# Patient Record
Sex: Female | Born: 1945 | Race: White | Hispanic: No | Marital: Married | Smoking: Never smoker
Health system: Southern US, Community
[De-identification: ages and names within clinical notes are randomized; demographics above are authoritative.]

## PROBLEM LIST (undated history)

## (undated) ENCOUNTER — Ambulatory Visit: Admission: EM | Payer: Medicare HMO | Source: Home / Self Care

## (undated) DIAGNOSIS — E785 Hyperlipidemia, unspecified: Secondary | ICD-10-CM

## (undated) DIAGNOSIS — I456 Pre-excitation syndrome: Secondary | ICD-10-CM

## (undated) DIAGNOSIS — C4490 Unspecified malignant neoplasm of skin, unspecified: Secondary | ICD-10-CM

## (undated) DIAGNOSIS — E119 Type 2 diabetes mellitus without complications: Secondary | ICD-10-CM

## (undated) DIAGNOSIS — G459 Transient cerebral ischemic attack, unspecified: Secondary | ICD-10-CM

## (undated) DIAGNOSIS — I10 Essential (primary) hypertension: Secondary | ICD-10-CM

## (undated) HISTORY — DX: Pre-excitation syndrome: I45.6

## (undated) HISTORY — PX: SEGMENTECOMY: SHX5076

## (undated) HISTORY — PX: BREAST EXCISIONAL BIOPSY: SUR124

## (undated) HISTORY — PX: BREAST BIOPSY: SHX20

## (undated) HISTORY — DX: Transient cerebral ischemic attack, unspecified: G45.9

## (undated) HISTORY — DX: Hyperlipidemia, unspecified: E78.5

## (undated) HISTORY — DX: Unspecified malignant neoplasm of skin, unspecified: C44.90

## (undated) HISTORY — PX: OTHER SURGICAL HISTORY: SHX169

## (undated) HISTORY — PX: BACK SURGERY: SHX140

---

## 1989-06-10 HISTORY — PX: CHOLECYSTECTOMY: SHX55

## 2004-03-21 ENCOUNTER — Ambulatory Visit: Payer: Self-pay | Admitting: Internal Medicine

## 2006-04-18 ENCOUNTER — Ambulatory Visit: Payer: Self-pay | Admitting: Gastroenterology

## 2008-12-13 ENCOUNTER — Ambulatory Visit: Payer: Self-pay | Admitting: Family Medicine

## 2010-11-15 ENCOUNTER — Ambulatory Visit: Payer: Self-pay | Admitting: Allergy

## 2011-08-15 ENCOUNTER — Institutional Professional Consult (permissible substitution): Payer: Self-pay | Admitting: Pulmonary Disease

## 2011-09-24 ENCOUNTER — Encounter: Payer: Self-pay | Admitting: Pulmonary Disease

## 2011-09-24 ENCOUNTER — Ambulatory Visit (INDEPENDENT_AMBULATORY_CARE_PROVIDER_SITE_OTHER): Payer: Medicare Other | Admitting: Pulmonary Disease

## 2011-09-24 DIAGNOSIS — J45909 Unspecified asthma, uncomplicated: Secondary | ICD-10-CM

## 2011-09-24 DIAGNOSIS — J310 Chronic rhinitis: Secondary | ICD-10-CM | POA: Insufficient documentation

## 2011-09-24 DIAGNOSIS — K219 Gastro-esophageal reflux disease without esophagitis: Secondary | ICD-10-CM

## 2011-09-24 DIAGNOSIS — I456 Pre-excitation syndrome: Secondary | ICD-10-CM

## 2011-09-24 DIAGNOSIS — E785 Hyperlipidemia, unspecified: Secondary | ICD-10-CM | POA: Insufficient documentation

## 2011-09-24 NOTE — Assessment & Plan Note (Signed)
Well controlled with diet and exercise  Plan: -reviewed dietary and lifestyle modifications in clinic today

## 2011-09-24 NOTE — Patient Instructions (Signed)
Continue taking your Advair and proAir as prescribed. We will see you back in two months and decide at that point if you could step down your Advair to an inhaled steroid alone.

## 2011-09-24 NOTE — Assessment & Plan Note (Addendum)
Debbie Wyatt symptoms of intermittent wheezing, cough and dyspnea are consistent with mild persistent asthma which is very well controlled on Advair.  She had markedly more symptoms off the controller med but now very rarely has cough or wheeze.  She really doesn't need both salmeterol and fluticasone, so we talked today about stepping down her therapy to an inhaled steroid alone.    Plan: -obtain records from Dr. Mayo Ao -she would prefer to stay on the Advair through the allergy season -in three months we will discuss changing the Advair to an inhaled steroid alone -continue albuterol prn -advised to use the Magic Mouthwash she has at home and to rinse her mouth after inhaler use

## 2011-09-24 NOTE — Assessment & Plan Note (Signed)
It sounds like this is mostly allergic in nature and currently under control.  Plan: -obtain records from Allergist -continue montelukast, flonase as written

## 2011-09-24 NOTE — Progress Notes (Signed)
Subjective:    Patient ID: Debbie Wyatt, female    DOB: 1945-07-20, 66 y.o.   MRN: [id number]  HPI This is a 66 y/o female with recurrent sinus infections, allergic rhinitis and asthma presents to our clinic for evalution of asthma.  She had a normal childhood without significant respiratory symptoms.  She developed allergic rhinitis later in adulthood and has been seen by an allergist and ENT physician for the same.  She has developed multiple respiratory infections over the years and has been on many lengthy steroid and antibiotic courses.  She has undergone allergy testing but can't recall the names of the allergens.  She was eventually referred to a pulmonologist when she started developing mucus production, wheezing and cough.  She saw Dr. Mayo Ao who said that she had asthma and continued the Symbicort which had been started by her PCP.  She recently started using Advair due to insurance reasons.  She states that her cough, wheezing and mucus production have been very well controlled on the Advair and she only needs to use albuterol once every month or so.  She denies chest pain or significant swelling in her legs.  She has occassional reflux which she is able to control well by controlling her diet.  She has had multiple episodes of bronchitis over the years which typically occur after sinus infections. She often responds to Azithromycin, but when this doesn't work she responds well to Levaquin.  Past Medical History  Diagnosis Date  . WPW (Wolff-Parkinson-White syndrome)   . Asthma   . Hyperlipidemia   . Skin cancer   . Glaucoma   . TIA (transient ischemic attack)      Family History  Problem Relation Age of Onset  . Emphysema Father     smoked and was a Printmaker  . Heart disease Mother      History   Social History  . Marital Status: Married    Spouse Name: N/A    Number of Children: 2  . Years of Education: N/A   Occupational History  . Retired Runner, broadcasting/film/video   . Hospital  Chaplain    Social History Main Topics  . Smoking status: Never Smoker   . Smokeless tobacco: Never Used  . Alcohol Use: No  . Drug Use: No  . Sexually Active: Not on file   Other Topics Concern  . Not on file   Social History Narrative  . No narrative on file     Allergies  Allergen Reactions  . Percocet (Oxycodone-Acetaminophen) Itching  . Bextra (Valdecoxib) Palpitations     No outpatient prescriptions prior to visit.    Review of Systems  Constitutional: Negative for fever, chills and unexpected weight change.  HENT: Positive for congestion, sneezing and sinus pressure. Negative for ear pain, nosebleeds, sore throat, rhinorrhea, trouble swallowing, dental problem, voice change and postnasal drip.   Eyes: Negative for visual disturbance.  Respiratory: Positive for cough. Negative for choking and shortness of breath.   Cardiovascular: Negative for chest pain and leg swelling.  Gastrointestinal: Negative for vomiting, abdominal pain and diarrhea.  Genitourinary: Negative for difficulty urinating.  Musculoskeletal: Positive for arthralgias.  Skin: Negative for rash.  Neurological: Positive for headaches. Negative for tremors and syncope.  Hematological: Does not bruise/bleed easily.       Objective:   Physical Exam Filed Vitals:   09/24/11 1516  BP: 114/66  Pulse: 70  Temp: 97.9 F (36.6 C)  TempSrc: Oral  Height: 5\' 2"  (1.575 m)  Weight: 214 lb (97.07 kg)  SpO2: 99%  BMI 39.6  Gen: morbidly obese white female in no acute distress HEENT: NCAT, PERRL, EOMi, OP with clear thrush, neck supple without masses PULM: CTA B CV: RRR, no mgr, no JVD AB: BS+, soft, nontender, no hsm Ext: warm, trace edema, no clubbing, no cyanosis Derm: no rash or skin breakdown Neuro: A&Ox4, CN II-XII intact, strength 5/5 in all 4 extremities  09/23/11 Simple spirometry: Ratio 80%, FEV1 2.36L 111% predicted    Assessment & Plan:   Asthma Ms. Doggett's symptoms of intermittent  wheezing, cough and dyspnea are consistent with mild persistent asthma which is very well controlled on Advair.  She had markedly more symptoms off the controller med but now very rarely has cough or wheeze.  She really doesn't need both salmeterol and fluticasone, so we talked today about stepping down her therapy to an inhaled steroid alone.    Plan: -obtain records from Dr. Mayo Ao -she would prefer to stay on the Advair through the allergy season -in three months we will discuss changing the Advair to an inhaled steroid alone -continue albuterol prn -advised to use the Magic Mouthwash she has at home and to rinse her mouth after inhaler use  GERD (gastroesophageal reflux disease) Well controlled with diet and exercise  Plan: -reviewed dietary and lifestyle modifications in clinic today  Rhinitis, chronic It sounds like this is mostly allergic in nature and currently under control.  Plan: -obtain records from Allergist -continue montelukast, flonase as written    Updated Medication List Outpatient Encounter Prescriptions as of 09/24/2011  Medication Sig Dispense Refill  . Fluticasone-Salmeterol (ADVAIR) 250-50 MCG/DOSE AEPB Inhale 1 puff into the lungs every 12 (twelve) hours.      Marland Kitchen amLODipine-atorvastatin (CADUET) 10-40 MG per tablet Take 1 tablet by mouth daily.      Marland Kitchen atenolol (TENORMIN) 50 MG tablet Take 50 mg by mouth 2 (two) times daily.      . Azelastine HCl (ASTEPRO) 0.15 % SOLN Place 1 puff into the nose daily.      . brimonidine (ALPHAGAN) 0.15 % ophthalmic solution Place 1 drop into both eyes 2 (two) times daily.      . citalopram (CELEXA) 20 MG tablet Take 20 mg by mouth daily.      . dorzolamide-timolol (COSOPT) 22.3-6.8 MG/ML ophthalmic solution Place 1 drop into both eyes 2 (two) times daily.      . fluticasone (FLONASE) 50 MCG/ACT nasal spray Place 2 sprays into the nose daily.      Marland Kitchen latanoprost (XALATAN) 0.005 % ophthalmic solution Place 1 drop into both eyes  daily.      Marland Kitchen levocetirizine (XYZAL) 5 MG tablet Take 5 mg by mouth every evening.      . montelukast (SINGULAIR) 10 MG tablet Take 10 mg by mouth at bedtime.      Marland Kitchen omeprazole (PRILOSEC) 20 MG capsule Take 20 mg by mouth daily.      . Sodium Chloride-Sodium Bicarb (CLASSIC NETI POT SINUS WASH NA) Use as directed as needed      . TETRACYCLINE HCL PO Take 1 tablet by mouth 2 (two) times daily.

## 2011-09-25 ENCOUNTER — Ambulatory Visit (INDEPENDENT_AMBULATORY_CARE_PROVIDER_SITE_OTHER)
Admission: RE | Admit: 2011-09-25 | Discharge: 2011-09-25 | Disposition: A | Discharge status: Home / Self Care | Payer: Medicare Other | Source: Ambulatory Visit | Attending: Pulmonary Disease | Admitting: Pulmonary Disease

## 2011-09-25 ENCOUNTER — Telehealth: Payer: Self-pay | Admitting: *Deleted

## 2011-09-25 DIAGNOSIS — J45909 Unspecified asthma, uncomplicated: Secondary | ICD-10-CM

## 2011-09-25 NOTE — Telephone Encounter (Signed)
Pt called back. Informed her of cxr results.  Pt verbalized understanding. Nothing further needed.

## 2011-09-25 NOTE — Telephone Encounter (Signed)
Message copied by Salli Quarry on Wed Sep 25, 2011  4:17 PM ------      Message from: Veto Kemps B      Created: Wed Sep 25, 2011 11:22 AM       L or M,            Can you let her know that her CXR was normal?            Thanks,      B

## 2011-09-25 NOTE — Telephone Encounter (Signed)
LMOM for pt TCB 

## 2011-09-27 ENCOUNTER — Telehealth: Payer: Self-pay | Admitting: *Deleted

## 2011-09-27 NOTE — Telephone Encounter (Signed)
Spoke with pt and notified per Dr. Kendrick Fries that her cxr was normal. Pt verbalized understanding and states no questions.

## 2011-09-27 NOTE — Telephone Encounter (Signed)
Message copied by Christen Butter on Fri Sep 27, 2011  1:05 PM ------      Message from: Veto Kemps B      Created: Wed Sep 25, 2011 11:22 AM       L or M,            Can you let her know that her CXR was normal?            Thanks,      B

## 2011-11-11 ENCOUNTER — Ambulatory Visit: Payer: Medicare Other | Admitting: Pulmonary Disease

## 2011-11-21 ENCOUNTER — Encounter: Payer: Self-pay | Admitting: Pulmonary Disease

## 2011-11-21 ENCOUNTER — Ambulatory Visit (INDEPENDENT_AMBULATORY_CARE_PROVIDER_SITE_OTHER): Payer: Medicare Other | Admitting: Pulmonary Disease

## 2011-11-21 VITALS — BP 110/62 | HR 61 | Temp 98.3°F | Ht 62.0 in | Wt 215.8 lb

## 2011-11-21 DIAGNOSIS — J310 Chronic rhinitis: Secondary | ICD-10-CM

## 2011-11-21 MED ORDER — FLUTICASONE PROPIONATE HFA 110 MCG/ACT IN AERO: 1.0000 | INHALATION_SPRAY | Freq: Two times a day (BID) | RESPIRATORY_TRACT | Status: DC

## 2011-11-21 NOTE — Assessment & Plan Note (Signed)
This has been a stable interval for Debbie Wyatt with no rescue inhaler use. I think the most reasonable approach at this point is to de-escalate therapy by changing her from advair to flovent.  She will call us if her symptoms get worse after making this change.  She should continue her other therapies as written.  We will see her back in 2 months as she typically gets sick in August each year.  After that she can follow up on a 6 month basis.

## 2011-11-21 NOTE — Progress Notes (Signed)
Subjective:    Patient ID: Debbie Wyatt, female    DOB: 12-12-1945, 66 y.o.   MRN: [id number]  Synopsis: [name] is a pleasant lady with asthma and allergic rhinitis who first came to the [facility] in 09/2011.  09/23/11 Simple spirometry: Ratio 80%, FEV1 2.36L 111% predicted   HPI  11/21/11 ROV - She states that she has been doing very well.  She has not had to use her albuterol since the last visit and she made it through allergy season with little problems.  She recently changed to advair from symbicort due to insurance issues.  Her allergic rhinitis is doing well despite the allergens out.  She has had minimal shortness of breath.  She is willing to de-escalate her therapy.   Review of Systems Not obtained    Objective:   Physical Exam  Filed Vitals:   11/21/11 1342  BP: 110/62  Pulse: 61  Temp: 98.3 F (36.8 C)  TempSrc: Oral  Height: 5\' 2"  (1.575 m)  Weight: 215 lb 12.8 oz (97.886 kg)  SpO2: 97%   Gen: well appearing, no acute distress HEENT: NCAT, PERRL, EOMi, nasal mucosa erythematous, non edematous PULM: CTA B CV: RRR, no mgr, cannot assess JVD AB: BS+, soft, nontender, no hsm Ext: warm, trace edema, no clubbing, no cyanosis  09/23/11 Simple spirometry: Ratio 80%, FEV1 2.36L 111% predicted      Assessment & Plan:   Asthma This has been a stable interval for [name] with no rescue inhaler use. I think the most reasonable approach at this point is to de-escalate therapy by changing her from advair to flovent.  She will call us if her symptoms get worse after making this change.  She should continue her other therapies as written.  We will see her back in 2 months as she typically gets sick in August each year.  After that she can follow up on a 6 month basis.    Rhinitis, chronic Continue singulair and inhaled steroid.   Updated Medication List Outpatient Encounter Prescriptions as of 11/21/2011  Medication Sig Dispense Refill  .  amLODipine-atorvastatin (CADUET) 10-40 MG per tablet Take 1 tablet by mouth daily.      Marland Kitchen atenolol (TENORMIN) 50 MG tablet Take 50 mg by mouth 2 (two) times daily.      . Azelastine HCl (ASTEPRO) 0.15 % SOLN Place 1 puff into the nose daily.      . brimonidine (ALPHAGAN) 0.15 % ophthalmic solution Place 1 drop into both eyes 2 (two) times daily.      . citalopram (CELEXA) 20 MG tablet Take 20 mg by mouth daily.      . dorzolamide-timolol (COSOPT) 22.3-6.8 MG/ML ophthalmic solution Place 1 drop into both eyes 2 (two) times daily.      . fluticasone (FLONASE) 50 MCG/ACT nasal spray Place 2 sprays into the nose daily.      Marland Kitchen latanoprost (XALATAN) 0.005 % ophthalmic solution Place 1 drop into both eyes daily.      Marland Kitchen levocetirizine (XYZAL) 5 MG tablet Take 5 mg by mouth every evening.      . montelukast (SINGULAIR) 10 MG tablet Take 10 mg by mouth at bedtime.      Marland Kitchen omeprazole (PRILOSEC) 20 MG capsule Take 20 mg by mouth daily.      . Sodium Chloride-Sodium Bicarb (CLASSIC NETI POT SINUS WASH NA) Use as directed as needed      . DISCONTD: Fluticasone-Salmeterol (ADVAIR) 250-50 MCG/DOSE AEPB Inhale 1  puff into the lungs every 12 (twelve) hours.      . fluticasone (FLOVENT HFA) 110 MCG/ACT inhaler Inhale 1 puff into the lungs 2 (two) times daily.  1 Inhaler  2  . DISCONTD: fluticasone (FLOVENT HFA) 110 MCG/ACT inhaler Inhale 1 puff into the lungs 2 (two) times daily.  1 Inhaler  2  . DISCONTD: TETRACYCLINE HCL PO Take 1 tablet by mouth 2 (two) times daily.

## 2011-11-21 NOTE — Patient Instructions (Signed)
Stop Advair. Start flovent HFA 1 puff twice a day Call us if you notice shortness of breath, wheezing, cough. We will see you back in 2 months, overbook OK

## 2011-11-21 NOTE — Assessment & Plan Note (Signed)
Continue singulair and inhaled steroid.

## 2012-01-15 ENCOUNTER — Ambulatory Visit: Payer: Medicare Other | Admitting: Pulmonary Disease

## 2012-02-13 ENCOUNTER — Encounter: Payer: Self-pay | Admitting: Pulmonary Disease

## 2012-02-13 ENCOUNTER — Ambulatory Visit (INDEPENDENT_AMBULATORY_CARE_PROVIDER_SITE_OTHER): Payer: Medicare Other | Admitting: Pulmonary Disease

## 2012-02-13 VITALS — BP 144/80 | HR 77 | Temp 97.8°F | Ht 62.0 in | Wt 219.0 lb

## 2012-02-13 DIAGNOSIS — Z23 Encounter for immunization: Secondary | ICD-10-CM

## 2012-02-13 DIAGNOSIS — J45909 Unspecified asthma, uncomplicated: Secondary | ICD-10-CM

## 2012-02-13 MED ORDER — FLUTICASONE PROPIONATE HFA 110 MCG/ACT IN AERO: 1.0000 | INHALATION_SPRAY | Freq: Two times a day (BID) | RESPIRATORY_TRACT | Status: DC

## 2012-02-13 NOTE — Progress Notes (Signed)
Subjective:    Patient ID: Debbie Wyatt, female    DOB: 1945/08/24, 66 y.o.   MRN: [id number]  Synopsis: Ms. Jae Dire is a pleasant lady with asthma and allergic rhinitis who first came to the [facility] in 09/2011.  09/23/11 Simple spirometry: Ratio 80%, FEV1 2.36L 111% predicted   HPI   11/21/11 ROV - She states that she has been doing very well.  She has not had to use her albuterol since the last visit and she made it through allergy season with little problems.  She recently changed to advair from symbicort due to insurance issues.  Her allergic rhinitis is doing well despite the allergens out.  She has had minimal shortness of breath.  She is willing to de-escalate her therapy.  02/13/2012 ROV -- She has been doing well.  Since stepping down therapy to flovent she has not used albuterol once.  She has experienced some allergies in the last few days but no respiratory symptoms.  Her allergic rhinitis is well controlled.    Review of Systems  Not obtained    Objective:   Physical Exam   Filed Vitals:   02/13/12 1633  BP: 144/80  Pulse: 77  Temp: 97.8 F (36.6 C)  TempSrc: Oral  Height: 5\' 2"  (1.575 m)  Weight: 219 lb (99.338 kg)  SpO2: 97%   Gen: well appearing, no acute distress HEENT: NCAT, PERRL, EOMi, nasal mucosa erythematous, non edematous PULM: CTA B CV: RRR, no mgr, cannot assess JVD AB: BS+, soft, nontender, no hsm Ext: warm, trace edema, no clubbing, no cyanosis  09/23/11 Simple spirometry: Ratio 80%, FEV1 2.36L 111% predicted      Assessment & Plan:   Asthma Very stable interval after stepping down therapy to Flovent  Plan: -continue Flovent as written -continue prn albuterol -f/u in six months    Updated Medication List Outpatient Encounter Prescriptions as of 02/13/2012  Medication Sig Dispense Refill  . atenolol (TENORMIN) 50 MG tablet Take 50 mg by mouth 2 (two) times daily.      Marland Kitchen atorvastatin (LIPITOR) 40 MG tablet Take 40 mg by mouth  daily.      . Azelastine HCl (ASTEPRO) 0.15 % SOLN Place 1 puff into the nose daily.      . brimonidine (ALPHAGAN) 0.15 % ophthalmic solution Place 1 drop into both eyes 2 (two) times daily.      . citalopram (CELEXA) 20 MG tablet Take 20 mg by mouth daily.      . dorzolamide-timolol (COSOPT) 22.3-6.8 MG/ML ophthalmic solution Place 1 drop into both eyes 2 (two) times daily.      . fluticasone (FLONASE) 50 MCG/ACT nasal spray Place 2 sprays into the nose daily.      . fluticasone (FLOVENT HFA) 110 MCG/ACT inhaler Inhale 1 puff into the lungs 2 (two) times daily.  3 Inhaler  1  . latanoprost (XALATAN) 0.005 % ophthalmic solution Place 1 drop into both eyes daily.      Marland Kitchen levocetirizine (XYZAL) 5 MG tablet Take 5 mg by mouth every evening.      . montelukast (SINGULAIR) 10 MG tablet Take 10 mg by mouth at bedtime.      Marland Kitchen omeprazole (PRILOSEC) 20 MG capsule Take 20 mg by mouth daily.      . Sodium Chloride-Sodium Bicarb (CLASSIC NETI POT SINUS WASH NA) Use as directed as needed      . DISCONTD: fluticasone (FLOVENT HFA) 110 MCG/ACT inhaler Inhale 1 puff into the lungs 2 (  two) times daily.  1 Inhaler  2  . DISCONTD: amLODipine-atorvastatin (CADUET) 10-40 MG per tablet Take 1 tablet by mouth daily.

## 2012-02-13 NOTE — Patient Instructions (Signed)
Keep taking the Flovent as you are now We will see you back in six months or sooner if needed

## 2012-02-13 NOTE — Assessment & Plan Note (Signed)
Very stable interval after stepping down therapy to Flovent  Plan: -continue Flovent as written -continue prn albuterol -f/u in six months

## 2012-08-18 ENCOUNTER — Ambulatory Visit: Payer: Medicare Other | Admitting: Pulmonary Disease

## 2012-08-18 ENCOUNTER — Telehealth: Payer: Self-pay | Admitting: Pulmonary Disease

## 2012-08-18 NOTE — Telephone Encounter (Signed)
ATC patient x3. No return call back. Sent letter 08/18/12.  °

## 2012-09-09 ENCOUNTER — Ambulatory Visit (INDEPENDENT_AMBULATORY_CARE_PROVIDER_SITE_OTHER): Payer: Medicare Other | Admitting: Emergency Medicine

## 2012-09-09 ENCOUNTER — Encounter: Payer: Self-pay | Admitting: Emergency Medicine

## 2012-09-09 VITALS — BP 108/72 | HR 69 | Temp 98.0°F | Ht 62.0 in | Wt 213.0 lb

## 2012-09-09 DIAGNOSIS — J069 Acute upper respiratory infection, unspecified: Secondary | ICD-10-CM

## 2012-09-09 NOTE — Progress Notes (Signed)
Subjective:    Patient ID: Debbie Wyatt, female    DOB: May 23, 1946, 67 y.o.   MRN: [id number]  Synopsis: Debbie Wyatt is a pleasant lady with asthma and allergic rhinitis who first came to the [facility] in 09/2011.  09/23/11 Simple spirometry: Ratio 80%, FEV1 2.36L 111% predicted   HPI  11/21/11 ROV - She states that she has been doing very well.  She has not had to use her albuterol since the last visit and she made it through allergy season with little problems.  She recently changed to advair from symbicort due to insurance issues.  Her allergic rhinitis is doing well despite the allergens out.  She has had minimal shortness of breath.  She is willing to de-escalate her therapy.  02/13/2012 ROV -- She has been doing well.  Since stepping down therapy to flovent she has not used albuterol once.  She has experienced some allergies in the last few days but no respiratory symptoms.  Her allergic rhinitis is well controlled.    Acute OV 09/09/12 -- 67 yo woman w asthma, allergic rhinitis. She presents today c/o onset cough, nasal congestion + sneezing x 4 days, ? Some low-pitched wheeze. No clear sick exposure. She was exposed to out doors last week after clearing branches. She was seen by ENT >> started on medrol 3/31, levaquin, fluconazole for possible thrush.  Currently on flovent + SABA; other allergy regimen is good    Review of Systems Not obtained     Objective:   Physical Exam  Filed Vitals:   09/09/12 1522  BP: 108/72  Pulse: 69  Temp: 98 F (36.7 C)  TempSrc: Oral  Height: 5\' 2"  (1.575 m)  Weight: 213 lb (96.616 kg)  SpO2: 98%   Gen: well appearing, no acute distress HEENT: NCAT, PERRL, EOMi, nasal mucosa erythematous, non edematous PULM: CTA B CV: RRR, no mgr, cannot assess JVD AB: BS+, soft, nontender, no hsm Ext: warm, trace edema, no clubbing, no cyanosis  09/23/11 Simple spirometry: Ratio 80%, FEV1 2.36L 111% predicted      Assessment & Plan:   URI (upper  respiratory infection) Appears to be a viral URI (vs allergy flare, but she is on all the right regimen  For allergies). Exacerbating cough, no real wheeze today but on medrol.  - unfortunately all I can offer is symptomatic relief. She is on pred + abx + great allergy regimen.  - will increase her astelin to see if this helps sx.  - asked her to call if no better after 7 days.     Updated Medication List Outpatient Encounter Prescriptions as of 09/09/2012  Medication Sig Dispense Refill  . albuterol (PROAIR HFA) 108 (90 BASE) MCG/ACT inhaler Inhale 2 puffs into the lungs every 6 (six) hours as needed for wheezing.      Marland Kitchen atenolol (TENORMIN) 50 MG tablet Take 50 mg by mouth 2 (two) times daily.      Marland Kitchen atorvastatin (LIPITOR) 40 MG tablet Take 40 mg by mouth daily.      . Azelastine HCl (ASTEPRO) 0.15 % SOLN Place 1 puff into the nose daily.      . brimonidine (ALPHAGAN) 0.15 % ophthalmic solution Place 1 drop into both eyes 2 (two) times daily.      . citalopram (CELEXA) 20 MG tablet Take 20 mg by mouth daily.      . dorzolamide-timolol (COSOPT) 22.3-6.8 MG/ML ophthalmic solution Place 1 drop into both eyes 2 (two) times daily.      Marland Kitchen  fluticasone (FLONASE) 50 MCG/ACT nasal spray Place 2 sprays into the nose daily.      . fluticasone (FLOVENT HFA) 110 MCG/ACT inhaler Inhale 1 puff into the lungs 2 (two) times daily.  3 Inhaler  1  . latanoprost (XALATAN) 0.005 % ophthalmic solution Place 1 drop into both eyes daily.      Marland Kitchen levocetirizine (XYZAL) 5 MG tablet Take 5 mg by mouth every evening.      . montelukast (SINGULAIR) 10 MG tablet Take 10 mg by mouth at bedtime.      Marland Kitchen omeprazole (PRILOSEC) 20 MG capsule Take 20 mg by mouth daily.      . Sodium Chloride-Sodium Bicarb (CLASSIC NETI POT SINUS WASH NA) Use as directed as needed       No facility-administered encounter medications on file as of 09/09/2012.    URI (upper respiratory infection) Appears to be a viral URI (vs allergy flare, but  she is on all the right regimen  For allergies). Exacerbating cough, no real wheeze today but on medrol.  - unfortunately all I can offer is symptomatic relief. She is on pred + abx + great allergy regimen.  - will increase her astelin to see if this helps sx.  - asked her to call if no better after 7 days.

## 2012-09-09 NOTE — Patient Instructions (Addendum)
Please continue your medications as you are taking them Increase your astepro to 2 sprays each nostril 2-3 x a day Please call our office if you are not making any progress in the next 7 days.

## 2012-09-09 NOTE — Assessment & Plan Note (Signed)
Appears to be a viral URI (vs allergy flare, but she is on all the right regimen  For allergies). Exacerbating cough, no real wheeze today but on medrol.  - unfortunately all I can offer is symptomatic relief. She is on pred + abx + great allergy regimen.  - will increase her astelin to see if this helps sx.  - asked her to call if no better after 7 days.

## 2012-09-22 ENCOUNTER — Ambulatory Visit (INDEPENDENT_AMBULATORY_CARE_PROVIDER_SITE_OTHER): Payer: Medicare Other | Admitting: Pulmonary Disease

## 2012-09-22 ENCOUNTER — Encounter: Payer: Self-pay | Admitting: Pulmonary Disease

## 2012-09-22 VITALS — BP 130/80 | HR 66 | Temp 97.4°F | Ht 62.0 in | Wt 217.4 lb

## 2012-09-22 DIAGNOSIS — J45909 Unspecified asthma, uncomplicated: Secondary | ICD-10-CM

## 2012-09-22 DIAGNOSIS — J310 Chronic rhinitis: Secondary | ICD-10-CM

## 2012-09-22 MED ORDER — FLUTICASONE PROPIONATE HFA 110 MCG/ACT IN AERO: 2.0000 | INHALATION_SPRAY | Freq: Two times a day (BID) | RESPIRATORY_TRACT | Status: DC

## 2012-09-22 NOTE — Assessment & Plan Note (Signed)
Continue a nasal antihistamine, Flonase, Singulair, and oral second generation antihistamine as written.

## 2012-09-22 NOTE — Patient Instructions (Signed)
Increase your Flovent to 2 puffs twice a day Continue using your flonase, astepro, and Xyzal as you are doing Use your Neti pot at least once a day for the next week, then as needed  We will see you back in in 6 weeks or sooner if needed

## 2012-09-22 NOTE — Assessment & Plan Note (Signed)
Debbie Wyatt is slowly improving from a viral URI, but I think that allergies are slowing the process. She is still wheezing on exam today.  Plan: -Increase Flovent to 2 puffs twice a day for the next 6 weeks -Continue albuterol and Singulair

## 2012-09-22 NOTE — Progress Notes (Signed)
Subjective:    Patient ID: Debbie Wyatt, female    DOB: 06-25-1945, 67 y.o.   MRN: [id number]  Synopsis: Debbie Wyatt is a pleasant lady with asthma and allergic rhinitis who first came to the [facility] in 09/2011.  09/23/11 Simple spirometry: Ratio 80%, FEV1 2.36L 111% predicted   HPI   11/21/11 ROV - She states that she has been doing very well.  She has not had to use her albuterol since the last visit and she made it through allergy season with little problems.  She recently changed to advair from symbicort due to insurance issues.  Her allergic rhinitis is doing well despite the allergens out.  She has had minimal shortness of breath.  She is willing to de-escalate her therapy.  02/13/2012 ROV -- She has been doing well.  Since stepping down therapy to flovent she has not used albuterol once.  She has experienced some allergies in the last few days but no respiratory symptoms.  Her allergic rhinitis is well controlled.    Acute OV 09/09/12 -- 67 yo woman w asthma, allergic rhinitis. She presents today c/o onset cough, nasal congestion + sneezing x 4 days, ? Some low-pitched wheeze. No clear sick exposure. She was exposed to out doors last week after clearing branches. She was seen by ENT >> started on medrol 3/31, levaquin, fluconazole for possible thrush.  Currently on flovent + SABA; other allergy regimen is good    09/22/12 follow up - Debbie Wyatt is following up for evaluation of a recent URI in the setting of asthma and allergic rhinitis.  She has been given prednisone, Levaquin, and instructed to increase her Astepro dosing over the last few weeks.  She states that she is making very slow improvement but she is definitely better since her last visit with [place] in [place].  She still has minimal energy.  She was doing the Netti pot once a day but she backed off on this some.  She says that increasing the Astepro has helped.  She still has sinus congestion and some wheezing and cough.   Past  Medical History  Diagnosis Date  . WPW (Wolff-Parkinson-White syndrome)   . Asthma   . Hyperlipidemia   . Skin cancer   . Glaucoma(365)   . TIA (transient ischemic attack)      Review of Systems  Constitutional: Positive for fatigue. Negative for fever and chills.  HENT: Positive for congestion, rhinorrhea and postnasal drip.   Respiratory: Positive for cough and wheezing. Negative for shortness of breath.   Cardiovascular: Negative for chest pain, palpitations and leg swelling.       Objective:   Physical Exam   Filed Vitals:   09/22/12 1602  BP: 130/80  Pulse: 66  Temp: 97.4 F (36.3 C)  TempSrc: Oral  Height: 5\' 2"  (1.575 m)  Weight: 217 lb 6.4 oz (98.612 kg)  SpO2: 98%  RA  Gen: well appearing, no acute distress HEENT: NCAT, EOMi, nasal mucosa edematous PULM: Exp wheezing bilaterally CV: RRR, no mgr, cannot assess JVD AB: BS+, soft, nontender, no hsm Ext: warm, trace edema, no clubbing, no cyanosis  09/23/11 Simple spirometry: Ratio 80%, FEV1 2.36L 111% predicted      Assessment & Plan:   Asthma Debbie Wyatt is slowly improving from a viral URI, but I think that allergies are slowing the process. She is still wheezing on exam today.  Plan: -Increase Flovent to 2 puffs twice a day for the next 6 weeks -Continue albuterol  and Singulair  Rhinitis, chronic Continue a nasal antihistamine, Flonase, Singulair, and oral second generation antihistamine as written.    Updated Medication List Outpatient Encounter Prescriptions as of 09/22/2012  Medication Sig Dispense Refill  . albuterol (PROAIR HFA) 108 (90 BASE) MCG/ACT inhaler Inhale 2 puffs into the lungs every 6 (six) hours as needed for wheezing.      Marland Kitchen atenolol (TENORMIN) 50 MG tablet Take 50 mg by mouth 2 (two) times daily.      Marland Kitchen atorvastatin (LIPITOR) 40 MG tablet Take 40 mg by mouth daily.      . Azelastine HCl (ASTEPRO) 0.15 % SOLN Place 1 puff into the nose daily.      . brimonidine (ALPHAGAN) 0.15 %  ophthalmic solution Place 1 drop into both eyes 2 (two) times daily.      . citalopram (CELEXA) 20 MG tablet Take 20 mg by mouth daily.      . dorzolamide-timolol (COSOPT) 22.3-6.8 MG/ML ophthalmic solution Place 1 drop into both eyes 2 (two) times daily.      . fluticasone (FLONASE) 50 MCG/ACT nasal spray Place 2 sprays into the nose daily.      . fluticasone (FLOVENT HFA) 110 MCG/ACT inhaler Inhale 1 puff into the lungs 2 (two) times daily.  3 Inhaler  1  . latanoprost (XALATAN) 0.005 % ophthalmic solution Place 1 drop into both eyes daily.      Marland Kitchen levocetirizine (XYZAL) 5 MG tablet Take 5 mg by mouth every evening.      . montelukast (SINGULAIR) 10 MG tablet Take 10 mg by mouth at bedtime.      Marland Kitchen omeprazole (PRILOSEC) 20 MG capsule Take 20 mg by mouth daily.      . Sodium Chloride-Sodium Bicarb (CLASSIC NETI POT SINUS WASH NA) Use as directed as needed       No facility-administered encounter medications on file as of 09/22/2012.

## 2012-11-03 ENCOUNTER — Encounter: Payer: Self-pay | Admitting: Pulmonary Disease

## 2012-11-03 ENCOUNTER — Ambulatory Visit (INDEPENDENT_AMBULATORY_CARE_PROVIDER_SITE_OTHER): Payer: Medicare Other | Admitting: Pulmonary Disease

## 2012-11-03 VITALS — BP 100/62 | HR 96 | Temp 98.0°F | Ht 65.0 in | Wt 213.0 lb

## 2012-11-03 DIAGNOSIS — J45909 Unspecified asthma, uncomplicated: Secondary | ICD-10-CM

## 2012-11-03 MED ORDER — ALBUTEROL SULFATE HFA 108 (90 BASE) MCG/ACT IN AERS: 2.0000 | INHALATION_SPRAY | Freq: Four times a day (QID) | RESPIRATORY_TRACT | Status: DC | PRN

## 2012-11-03 NOTE — Patient Instructions (Signed)
Use your albuterol inhaler ten minutes before exercise Keep using your flovent as you are doing  We will see you back in 6 months

## 2012-11-03 NOTE — Progress Notes (Signed)
Subjective:    Patient ID: Debbie Wyatt, female    DOB: Mar 19, 1946, 67 y.o.   MRN: [id number]  Synopsis: Debbie Wyatt is a pleasant lady with asthma and allergic rhinitis who first came to the [facility] in 09/2011.  09/23/11 Simple spirometry: Ratio 80%, FEV1 2.36L 111% predicted   HPI   11/21/11 ROV - She states that she has been doing very well.  She has not had to use her albuterol since the last visit and she made it through allergy season with little problems.  She recently changed to advair from symbicort due to insurance issues.  Her allergic rhinitis is doing well despite the allergens out.  She has had minimal shortness of breath.  She is willing to de-escalate her therapy.  02/13/2012 ROV -- She has been doing well.  Since stepping down therapy to flovent she has not used albuterol once.  She has experienced some allergies in the last few days but no respiratory symptoms.  Her allergic rhinitis is well controlled.    Acute OV 09/09/12 -- 67 yo woman w asthma, allergic rhinitis. She presents today c/o onset cough, nasal congestion + sneezing x 4 days, ? Some low-pitched wheeze. No clear sick exposure. She was exposed to out doors last week after clearing branches. She was seen by ENT >> started on medrol 3/31, levaquin, fluconazole for possible thrush.  Currently on flovent + SABA; other allergy regimen is good    09/22/12 follow up - Debbie Wyatt is following up for evaluation of a recent URI in the setting of asthma and allergic rhinitis.  She has been given prednisone, Levaquin, and instructed to increase her Astepro dosing over the last few weeks.  She states that she is making very slow improvement but she is definitely better since her last visit with [place] in [place].  She still has minimal energy.  She was doing the Netti pot once a day but she backed off on this some.  She says that increasing the Astepro has helped.  She still has sinus congestion and some wheezing and cough.  11/03/2012  ROV --Debbie Wyatt is doing well since her last visit. She has not had recurrent wheezing and her sinus congestion and postnasal drip has improved. She did have an episode of hoarseness after exposed to some smokers a week ago when she was at a concert. Aside from that things have been doing well. She is recently discovered that there is a lot of mold in her home's ductwork and so this is currently being replaced. She continues to take the Flovent at a higher dose and the nasal antihistamine and corticosteroid.   Past Medical History  Diagnosis Date  . WPW (Wolff-Parkinson-White syndrome)   . Asthma   . Hyperlipidemia   . Skin cancer   . Glaucoma(365)   . TIA (transient ischemic attack)      Review of Systems  Constitutional: Negative for fever, chills and fatigue.  HENT: Negative for congestion, rhinorrhea and postnasal drip.   Respiratory: Negative for cough, shortness of breath and wheezing.   Cardiovascular: Negative for chest pain, palpitations and leg swelling.       Objective:   Physical Exam   Filed Vitals:   11/03/12 1056  BP: 100/62  Pulse: 96  Temp: 98 F (36.7 C)  TempSrc: Oral  Height: 5\' 5"  (1.651 m)  Weight: 213 lb (96.616 kg)  SpO2: 98%  RA  Gen: well appearing, no acute distress HEENT: NCAT, EOMi, nasal mucosa normal  PULM: Clear to hospital dictation bilaterally CV: RRR, no mgr, cannot assess JVD AB: BS+, soft, nontender, no hsm Ext: warm, trace edema, no clubbing, no cyanosis  09/23/11 Simple spirometry: Ratio 80%, FEV1 2.36L 111% predicted      Assessment & Plan:   Asthma This is been a stable interval for Debbie Wyatt. She is to continue taking the Flovent at 2 puffs twice a day. She should also continue on her current allergic rhinitis regimen as detailed below.    Updated Medication List Outpatient Encounter Prescriptions as of 11/03/2012  Medication Sig Dispense Refill  . albuterol (PROAIR HFA) 108 (90 BASE) MCG/ACT inhaler Inhale 2 puffs into the  lungs every 6 (six) hours as needed for wheezing.      Marland Kitchen atenolol (TENORMIN) 50 MG tablet Take 50 mg by mouth 2 (two) times daily.      Marland Kitchen atorvastatin (LIPITOR) 40 MG tablet Take 40 mg by mouth daily.      . Azelastine HCl (ASTEPRO) 0.15 % SOLN Place 1 puff into the nose daily.      . brimonidine (ALPHAGAN) 0.15 % ophthalmic solution Place 1 drop into both eyes 2 (two) times daily.      . citalopram (CELEXA) 20 MG tablet Take 20 mg by mouth daily.      . dorzolamide-timolol (COSOPT) 22.3-6.8 MG/ML ophthalmic solution Place 1 drop into both eyes 2 (two) times daily.      . fluticasone (FLONASE) 50 MCG/ACT nasal spray Place 2 sprays into the nose daily.      . fluticasone (FLOVENT HFA) 110 MCG/ACT inhaler Inhale 2 puffs into the lungs 2 (two) times daily.  3 Inhaler  1  . latanoprost (XALATAN) 0.005 % ophthalmic solution Place 1 drop into both eyes daily.      Marland Kitchen levocetirizine (XYZAL) 5 MG tablet Take 5 mg by mouth every evening.      . montelukast (SINGULAIR) 10 MG tablet Take 10 mg by mouth at bedtime.      Marland Kitchen omeprazole (PRILOSEC) 20 MG capsule Take 20 mg by mouth daily.      . Sodium Chloride-Sodium Bicarb (CLASSIC NETI POT SINUS WASH NA) Use as directed as needed       No facility-administered encounter medications on file as of 11/03/2012.

## 2012-11-03 NOTE — Assessment & Plan Note (Signed)
This is been a stable interval for Debbie Wyatt. She is to continue taking the Flovent at 2 puffs twice a day. She should also continue on her current allergic rhinitis regimen as detailed below.

## 2013-02-23 ENCOUNTER — Ambulatory Visit: Payer: Self-pay

## 2013-05-24 ENCOUNTER — Ambulatory Visit (INDEPENDENT_AMBULATORY_CARE_PROVIDER_SITE_OTHER): Payer: Medicare Other | Admitting: Pulmonary Disease

## 2013-05-24 ENCOUNTER — Encounter: Payer: Self-pay | Admitting: Pulmonary Disease

## 2013-05-24 VITALS — BP 112/58 | HR 70 | Ht 62.0 in | Wt 220.0 lb

## 2013-05-24 DIAGNOSIS — J310 Chronic rhinitis: Secondary | ICD-10-CM

## 2013-05-24 DIAGNOSIS — J45909 Unspecified asthma, uncomplicated: Secondary | ICD-10-CM

## 2013-05-24 NOTE — Progress Notes (Signed)
Subjective:    Patient ID: Debbie Wyatt, female    DOB: Jan 19, 1946, 67 y.o.   MRN: [id number]  Synopsis: Debbie Wyatt is a pleasant lady with asthma and allergic rhinitis who first came to the [facility] in 09/2011.  09/23/11 Simple spirometry: Ratio 80%, FEV1 2.36L 111% predicted   HPI   11/21/11 ROV - She states that she has been doing very well.  She has not had to use her albuterol since the last visit and she made it through allergy season with little problems.  She recently changed to advair from symbicort due to insurance issues.  Her allergic rhinitis is doing well despite the allergens out.  She has had minimal shortness of breath.  She is willing to de-escalate her therapy.  02/13/2012 ROV -- She has been doing well.  Since stepping down therapy to flovent she has not used albuterol once.  She has experienced some allergies in the last few days but no respiratory symptoms.  Her allergic rhinitis is well controlled.    Acute OV 09/09/12 -- 67 yo woman w asthma, allergic rhinitis. She presents today c/o onset cough, nasal congestion + sneezing x 4 days, ? Some low-pitched wheeze. No clear sick exposure. She was exposed to out doors last week after clearing branches. She was seen by ENT >> started on medrol 3/31, levaquin, fluconazole for possible thrush.  Currently on flovent + SABA; other allergy regimen is good    09/22/12 follow up - Debbie Wyatt is following up for evaluation of a recent URI in the setting of asthma and allergic rhinitis.  She has been given prednisone, Levaquin, and instructed to increase her Astepro dosing over the last few weeks.  She states that she is making very slow improvement but she is definitely better since her last visit with [place] in [place].  She still has minimal energy.  She was doing the Netti pot once a day but she backed off on this some.  She says that increasing the Astepro has helped.  She still has sinus congestion and some wheezing and cough.  11/03/2012  ROV --Debbie Wyatt is doing well since her last visit. She has not had recurrent wheezing and her sinus congestion and postnasal drip has improved. She did have an episode of hoarseness after exposed to some smokers a week ago when she was at a concert. Aside from that things have been doing well. She is recently discovered that there is a lot of mold in her home's ductwork and so this is currently being replaced. She continues to take the Flovent at a higher dose and the nasal antihistamine and corticosteroid.  05/24/2014 ROV >> Debbie Wyatt has been doing well since the last visit.  She wants to buy a Geophysicist/field seismologist.  Her husband won't let her.  Debbie Wyatt insurance is about to change coverage.  She may have to go with a mail order pharmacy.  She has been doing fine, no shortness of breath, no cough, and her sinus symptoms are stable.   Past Medical History  Diagnosis Date  . WPW (Wolff-Parkinson-White syndrome)   . Asthma   . Hyperlipidemia   . Skin cancer   . Glaucoma   . TIA (transient ischemic attack)      Review of Systems  Constitutional: Negative for fever, chills and fatigue.  HENT: Negative for congestion, postnasal drip and rhinorrhea.   Respiratory: Negative for cough, shortness of breath and wheezing.   Cardiovascular: Negative for chest pain, palpitations and leg swelling.  Objective:   Physical Exam   Filed Vitals:   05/24/13 1622  BP: 112/58  Pulse: 70  Height: 5\' 2"  (1.575 m)  Weight: 220 lb (99.791 kg)  SpO2: 99%  RA  Gen: well appearing, no acute distress HEENT: NCAT, EOMi, nasal mucosa normal PULM: Clear to auscultation bilaterally CV: RRR, no mgr, cannot assess JVD AB: BS+, soft, nontender, no hsm Ext: warm, trace edema, no clubbing, no cyanosis  09/23/11 Simple spirometry: Ratio 80%, FEV1 2.36L 111% predicted      Assessment & Plan:   Asthma This has been a stable interval for [name].  Her moderate persistent asthma is well controlled on Flovent alone.  Unfortunately her insurance formulary is changing.  Plan: -continue flovent for now -she will provide me a copy of the new formulary tomorrow so we can decide what medicine will work for her  Rhinitis, chronic Continue nasal steroid, nasal antihistamine, singulair, and oral antihistamine    Updated Medication List Outpatient Encounter Prescriptions as of 05/24/2013  Medication Sig  . albuterol (PROAIR HFA) 108 (90 BASE) MCG/ACT inhaler Inhale 2 puffs into the lungs every 6 (six) hours as needed for wheezing (use 10 minutes before exercise).  Marland Kitchen atenolol (TENORMIN) 50 MG tablet Take 50 mg by mouth 2 (two) times daily.  Marland Kitchen atorvastatin (LIPITOR) 40 MG tablet Take 40 mg by mouth daily.  . Azelastine HCl (ASTEPRO) 0.15 % SOLN Place 1 puff into the nose daily.  . brimonidine (ALPHAGAN) 0.15 % ophthalmic solution Place 1 drop into both eyes 2 (two) times daily.  . citalopram (CELEXA) 20 MG tablet Take 20 mg by mouth daily.  . dorzolamide-timolol (COSOPT) 22.3-6.8 MG/ML ophthalmic solution Place 1 drop into both eyes 2 (two) times daily.  . fluticasone (FLONASE) 50 MCG/ACT nasal spray Place 2 sprays into the nose daily.  . fluticasone (FLOVENT HFA) 110 MCG/ACT inhaler Inhale 2 puffs into the lungs 2 (two) times daily.  Marland Kitchen latanoprost (XALATAN) 0.005 % ophthalmic solution Place 1 drop into both eyes daily.  Marland Kitchen levocetirizine (XYZAL) 5 MG tablet Take 5 mg by mouth every evening.  . montelukast (SINGULAIR) 10 MG tablet Take 10 mg by mouth at bedtime.  Marland Kitchen omeprazole (PRILOSEC) 20 MG capsule Take 20 mg by mouth daily.  . Sodium Chloride-Sodium Bicarb (CLASSIC NETI POT SINUS WASH NA) Use as directed as needed

## 2013-05-24 NOTE — Patient Instructions (Signed)
Keep taking your medicines as you are doing  Bring the formulary tomorrow and we will decide if there is a better inhaler option for you We will see you back in March 2015

## 2013-05-24 NOTE — Assessment & Plan Note (Signed)
Continue nasal steroid, nasal antihistamine, singulair, and oral antihistamine

## 2013-05-24 NOTE — Assessment & Plan Note (Signed)
This has been a stable interval for Debbie Wyatt.  Her moderate persistent asthma is well controlled on Flovent alone. Unfortunately her insurance formulary is changing.  Plan: -continue flovent for now -she will provide me a copy of the new formulary tomorrow so we can decide what medicine will work for her

## 2013-05-25 ENCOUNTER — Telehealth: Payer: Self-pay | Admitting: Pulmonary Disease

## 2013-05-26 NOTE — Telephone Encounter (Signed)
Message copied by Velvet Bathe on Wed May 26, 2013  1:21 PM ------      Message from: Max Fickle B      Created: Wed May 26, 2013 11:36 AM       A,            Please let her know that I can't find anything in her formulary that is better than tier 3.  So the flovent she is on now will stay the same but just be more expensive... Unfortunately.            We'll hold on to her formulary and mail it back to her ASAP.  I'll have it in my folder tomorrow.            Thanks      B ------

## 2013-05-26 NOTE — Telephone Encounter (Signed)
LMTCB X1 

## 2013-05-26 NOTE — Telephone Encounter (Signed)
Spoke with pt, she is aware of our findings, I advised that we will be mailing her formulary back to her tomorrow.  Nothing further needed. Caulfield,Ashley L, CMA

## 2013-05-26 NOTE — Telephone Encounter (Signed)
Message copied by Velvet Bathe on Wed May 26, 2013  3:21 PM ------      Message from: Max Fickle B      Created: Wed May 26, 2013 11:36 AM       A,            Please let her know that I can't find anything in her formulary that is better than tier 3.  So the flovent she is on now will stay the same but just be more expensive... Unfortunately.            We'll hold on to her formulary and mail it back to her ASAP.  I'll have it in my folder tomorrow.            Thanks      B ------

## 2013-06-15 ENCOUNTER — Telehealth: Payer: Self-pay | Admitting: Pulmonary Disease

## 2013-06-15 MED ORDER — BENZONATATE 100 MG PO CAPS: 100.0000 mg | ORAL_CAPSULE | Freq: Four times a day (QID) | ORAL | Status: DC | PRN

## 2013-06-15 MED ORDER — PREDNISONE 10 MG PO TABS: ORAL_TABLET | ORAL | Status: DC

## 2013-06-15 NOTE — Telephone Encounter (Signed)
Spoke with pt. She c/o dry cough, slight chest cong, wheezing, "feels like kittens in her chest and back of throat" x 2 days. She is using her nasal spray and her flovent daily. No fever, no chills, no sweats, no chest tx. Please advise Dr. [place] Bells thanks  Allergies  Allergen Reactions  . Augmentin [Amoxicillin-Pot Clavulanate] Nausea And Vomiting  . Percocet [Oxycodone-Acetaminophen] Itching  . Bextra [Valdecoxib] Palpitations

## 2013-06-15 NOTE — Telephone Encounter (Signed)
Prednisone taper Call in [place] rec voice rest Call back/come in if no improvement by Friday or sooner if worse

## 2013-06-15 NOTE — Telephone Encounter (Signed)
Pt aware of recs. RX sent. Nothing further needed 

## 2013-06-26 ENCOUNTER — Ambulatory Visit: Payer: Self-pay | Admitting: Family Medicine

## 2014-08-31 ENCOUNTER — Encounter: Payer: Self-pay | Admitting: Pulmonary Disease

## 2014-08-31 ENCOUNTER — Ambulatory Visit (INDEPENDENT_AMBULATORY_CARE_PROVIDER_SITE_OTHER): Payer: Medicare Other | Admitting: Pulmonary Disease

## 2014-08-31 ENCOUNTER — Encounter (INDEPENDENT_AMBULATORY_CARE_PROVIDER_SITE_OTHER): Payer: Self-pay

## 2014-08-31 VITALS — BP 126/68 | HR 97 | Ht 62.0 in | Wt 206.0 lb

## 2014-08-31 DIAGNOSIS — B370 Candidal stomatitis: Secondary | ICD-10-CM | POA: Diagnosis not present

## 2014-08-31 DIAGNOSIS — J4540 Moderate persistent asthma, uncomplicated: Secondary | ICD-10-CM

## 2014-08-31 DIAGNOSIS — I456 Pre-excitation syndrome: Secondary | ICD-10-CM | POA: Diagnosis not present

## 2014-08-31 MED ORDER — FLUTICASONE-SALMETEROL 250-50 MCG/DOSE IN AEPB: 1.0000 | INHALATION_SPRAY | Freq: Two times a day (BID) | RESPIRATORY_TRACT | Status: DC

## 2014-08-31 NOTE — Assessment & Plan Note (Signed)
This is well-controlled currently after recent use of clotrimazole. She was reminded to rinse her mouth thoroughly after using inhalers. She has struggled with this over the years despite appropriate inhaler use, multiple inhaler delivery devices, and use of baking soda based toothpaste.

## 2014-08-31 NOTE — Assessment & Plan Note (Signed)
She has struggled with WPW as well as atrial fibrillation recently. She may need an ablation. Today on my physical exam she was in sinus rhythm.  Plan: -Minimize use

## 2014-08-31 NOTE — Patient Instructions (Signed)
Stop symbicort Start Advair 250/50 one puff twice a day F/u with [place] in 3-4 months or sooner if needed

## 2014-08-31 NOTE — Progress Notes (Signed)
Subjective:    Patient ID: Debbie Wyatt, female    DOB: Oct 21, 1945, 69 y.o.   MRN: [id number]  Synopsis: Debbie Wyatt is a pleasant lady with asthma and allergic rhinitis who first came to the [facility] in 09/2011.  09/23/11 Simple spirometry: Ratio 80%, FEV1 2.36L 111% predicted   HPI  Chief Complaint  Patient presents with  . Follow-up    last seen 05/2013.  Pt c/o recurrent thrush from inhaler.  pt recently came off prednisone and abx for URI.    Debbie Wyatt never bought a Adult nurse because she is afraid her dog will kill it.  She wishes that seh could get her dog on Medicaid because she ate a lot of fudge recently and incurred a large vet bill.   Debbie Wyatt has been sick recently and has seen some 4 at [facility] including Eston Esters and had PFTs up there.  She was seen by the [facility]CP's because she was having a lot of trouble breathing with mucus in her throat.  She had to go to the doctor Wyatt other day for a while there and was found to be in A-fib.  She was doing a nebulizer four times a day and eventually ended up in the [place] ED and then was refrred to [facility] and has been started on anticoagulation (Eliquis) and tykosyn.  She was cardioverted.  She even passed out one day while in the shower and had a lot of fatigue, both of which she attributed to the a-fib.    She was put back on Symbicort due to recurrent exacerbations of asthma.  She has been limited by thrush.   I've reviewed the notes from [facility] from 07/2014 including the PFTs.  Past Medical History  Diagnosis Date  . WPW (Wolff-Parkinson-White syndrome)   . Asthma   . Hyperlipidemia   . Skin cancer   . Glaucoma   . TIA (transient ischemic attack)      Review of Systems  Constitutional: Negative for fever, chills and fatigue.  HENT: Negative for congestion, postnasal drip and rhinorrhea.   Respiratory: Negative for cough, shortness of breath and wheezing.   Cardiovascular: Negative for chest pain,  palpitations and leg swelling.       Objective:   Physical Exam  Filed Vitals:   08/31/14 0915  BP: 126/68  Pulse: 97  Height: 5\' 2"  (1.575 m)  Weight: 206 lb (93.441 kg)  SpO2: 100%  RA  Gen: well appearing, no acute distress HEENT: NCAT, EOMi, nasal mucosa normal, OP without thrush PULM: Clear to auscultation bilaterally CV: RRR, no mgr, cannot assess JVD AB: BS+, soft, nontender, no hsm Ext: warm, trace edema, no clubbing, no cyanosis  09/23/11 Simple spirometry: Ratio 80%, FEV1 2.36L 111% predicted      Assessment & Plan:   Asthma Debbie Wyatt struggled with recurrent exacerbations of her intermittent asthma back in the wintertime. Certainly, a significant portion of her symptoms comes from allergic rhinitis and laryngeal irritation she has no airflow obstruction, treated. I reviewed the records from [facility] and found that she did not have airflow obstruction on her pulmonary function testing in 2015.  Plan: -Use Advair discus twice a day considering that's what her insurance formulary covers -Stop Symbicort -Minimize albuterol   WPW (Wolff-Parkinson-White syndrome) She has struggled with WPW as well as atrial fibrillation recently. She may need an ablation. Today on my physical exam she was in sinus rhythm.  Plan: -Minimize use   Thrush This is well-controlled  currently after recent use of clotrimazole. She was reminded to rinse her mouth thoroughly after using inhalers. She has struggled with this over the years despite appropriate inhaler use, multiple inhaler delivery devices, and use of baking soda based toothpaste.    Greater than 25 minutes were spent in direct consultation with the patient today as well as time taken to review ER records, cardiology records, and pulmonary records, and pulmonary function testing from [facility] Updated Medication List Outpatient Encounter Prescriptions as of 08/31/2014  Medication Sig  . albuterol (PROAIR HFA) 108  (90 BASE) MCG/ACT inhaler Inhale 2 puffs into the lungs Wyatt 6 (six) hours as needed for wheezing (use 10 minutes before exercise).  Marland Kitchen atorvastatin (LIPITOR) 40 MG tablet Take 80 mg by mouth daily.   . Azelastine HCl (ASTEPRO) 0.15 % SOLN Place 1 puff into the nose daily.  . brimonidine (ALPHAGAN) 0.15 % ophthalmic solution Place 1 drop into both eyes 2 (two) times daily.  . butalbital-acetaminophen-caffeine (FIORICET, ESGIC) 50-325-40 MG per tablet Take 1 tablet by mouth Wyatt 4 (four) hours as needed for headache.  . dofetilide (TIKOSYN) 250 MCG capsule Take 250 mcg by mouth 2 (two) times daily.  . dorzolamide-timolol (COSOPT) 22.3-6.8 MG/ML ophthalmic solution Place 1 drop into both eyes 2 (two) times daily.  . DULoxetine (CYMBALTA) 30 MG capsule Take 30 mg by mouth daily.  . fluticasone (FLONASE) 50 MCG/ACT nasal spray Place 2 sprays into the nose daily.  Marland Kitchen latanoprost (XALATAN) 0.005 % ophthalmic solution Place 1 drop into both eyes daily.  Marland Kitchen levocetirizine (XYZAL) 5 MG tablet Take 5 mg by mouth Wyatt evening.  Marland Kitchen lisinopril (PRINIVIL,ZESTRIL) 10 MG tablet Take 10 mg by mouth daily.  . metoprolol succinate (TOPROL-XL) 25 MG 24 hr tablet Take 25 mg by mouth daily.  . montelukast (SINGULAIR) 10 MG tablet Take 10 mg by mouth at bedtime.  . Multiple Vitamin (MULTIVITAMIN WITH MINERALS) TABS tablet Take 1 tablet by mouth daily.  Marland Kitchen nystatin (MYCOSTATIN) 100000 UNIT/ML suspension Take 5 mLs by mouth 4 (four) times daily.  Marland Kitchen omeprazole (PRILOSEC) 20 MG capsule Take 20 mg by mouth daily.  . Sodium Chloride-Sodium Bicarb (CLASSIC NETI POT SINUS WASH NA) Use as directed as needed  . [DISCONTINUED] budesonide-formoterol (SYMBICORT) 80-4.5 MCG/ACT inhaler Inhale 2 puffs into the lungs 2 (two) times daily.  . Fluticasone-Salmeterol (ADVAIR DISKUS) 250-50 MCG/DOSE AEPB Inhale 1 puff into the lungs 2 (two) times daily.  . [DISCONTINUED] atenolol (TENORMIN) 50 MG tablet Take 50 mg by mouth 2 (two) times  daily.  . [DISCONTINUED] benzonatate (TESSALON) 100 MG capsule Take 1 capsule (100 mg total) by mouth Wyatt 6 (six) hours as needed for cough.  . [DISCONTINUED] citalopram (CELEXA) 20 MG tablet Take 20 mg by mouth daily.  . [DISCONTINUED] fluticasone (FLOVENT HFA) 110 MCG/ACT inhaler Inhale 2 puffs into the lungs 2 (two) times daily.  . [DISCONTINUED] predniSONE (DELTASONE) 10 MG tablet Take 4 tabs daily x 2 days, 3 tabs daily x 2 days, 2 tabs daily x 2 days, 1 tab daily x 2 days then stop

## 2014-08-31 NOTE — Assessment & Plan Note (Signed)
Debbie Wyatt struggled with recurrent exacerbations of her intermittent asthma back in the wintertime. Certainly, a significant portion of her symptoms comes from allergic rhinitis and laryngeal irritation she has no airflow obstruction, treated. I reviewed the records from [facility] and found that she did not have airflow obstruction on her pulmonary function testing in 2015.  Plan: -Use Advair discus twice a day considering that's what her insurance formulary covers -Stop Symbicort -Minimize albuterol

## 2014-12-13 ENCOUNTER — Ambulatory Visit (INDEPENDENT_AMBULATORY_CARE_PROVIDER_SITE_OTHER): Payer: Medicare Other | Admitting: Pulmonary Disease

## 2014-12-13 ENCOUNTER — Encounter: Payer: Self-pay | Admitting: Pulmonary Disease

## 2014-12-13 VITALS — BP 136/78 | HR 96 | Ht 62.0 in | Wt 202.0 lb

## 2014-12-13 DIAGNOSIS — B370 Candidal stomatitis: Secondary | ICD-10-CM

## 2014-12-13 DIAGNOSIS — J4530 Mild persistent asthma, uncomplicated: Secondary | ICD-10-CM

## 2014-12-13 MED ORDER — FLUCONAZOLE 100 MG PO TABS: 100.0000 mg | ORAL_TABLET | Freq: Every day | ORAL | Status: AC | PRN

## 2014-12-13 MED ORDER — NYSTATIN 100000 UNIT/ML MT SUSP: OROMUCOSAL | Status: DC

## 2014-12-13 MED ORDER — RIVAROXABAN 20 MG PO TABS: 20.0000 mg | ORAL_TABLET | Freq: Every day | ORAL | Status: DC

## 2014-12-13 NOTE — Progress Notes (Signed)
Subjective:    Patient ID: Debbie Wyatt, female    DOB: 08/29/1945, 69 y.o.   MRN: [id number]  Synopsis: [name] is a pleasant lady with asthma and allergic rhinitis who first came to the [facility] in 09/2011.  09/23/11 Simple spirometry: Ratio 80%, FEV1 2.36L 111% predicted   HPI  Chief Complaint  Patient presents with  . Follow-up    pt does well when avoiding humid temperatures.     Debbie Wyatt never had the ablation for the WPW and Afib.  She had her medication changed to Xarelto.  She continues to take the tikosyn and she has not been in afib.  She said that this really isn't bothering her too much. She is taking Advair bid which is helping.  No bouts of bronchitis since the last visit.  She is still struggling with thrush.  She took clotrimazole a few years ago which made her LFTs worse.   Past Medical History  Diagnosis Date  . WPW (Wolff-Parkinson-White syndrome)   . Asthma   . Hyperlipidemia   . Skin cancer   . Glaucoma   . TIA (transient ischemic attack)      Review of Systems  Constitutional: Negative for fever, chills and fatigue.  HENT: Negative for congestion, postnasal drip and rhinorrhea.   Respiratory: Negative for cough, shortness of breath and wheezing.   Cardiovascular: Negative for chest pain, palpitations and leg swelling.       Objective:   Physical Exam  Filed Vitals:   12/13/14 1454  BP: 136/78  Pulse: 96  Height: 5\' 2"  (1.575 m)  Weight: 202 lb (91.627 kg)  SpO2: 100%  RA  Gen: well appearing, no acute distress HEENT: NCAT, EOMi, nasal mucosa normal, OP with thrush PULM: Clear to auscultation bilaterally CV: RRR, no mgr, cannot assess JVD AB: BS+, soft, nontender, no hsm Ext: warm, trace edema, no clubbing, no cyanosis  09/23/11 Simple spirometry: Ratio 80%, FEV1 2.36L 111% predicted      Assessment & Plan:   Asthma She has mild persistent asthma. This has been a stable interval for her. We have attempted stepdown therapy on  several occasions in the past and she has never been able to tolerate this without an episode of bronchitis with prolonged wheezing afterwards. She does well with Advair twice a day.  Plan: Continue Advair twice a day  Thrush This is a recurrent and persistent problem for her. She was once taking daily clotrimazole which led to liver function test abnormalities. She has thrush again on exam today. It is invariably due to the inhaled cortical steroid but unfortunately we have not been able to stop that medicine.  Plan: Use low-dose Diflucan (100 mg) no more than 1 time per month followed by a maintenance dose of nystatin swish and spit solution 3 times a day   Updated Medication List Outpatient Encounter Prescriptions as of 12/13/2014  Medication Sig  . albuterol (PROAIR HFA) 108 (90 BASE) MCG/ACT inhaler Inhale 2 puffs into the lungs Wyatt 6 (six) hours as needed for wheezing (use 10 minutes before exercise).  Marland Kitchen atorvastatin (LIPITOR) 40 MG tablet Take 80 mg by mouth daily.   . Azelastine HCl (ASTEPRO) 0.15 % SOLN Place 1 puff into the nose daily.  . brimonidine (ALPHAGAN) 0.15 % ophthalmic solution Place 1 drop into both eyes 2 (two) times daily.  . Butalbital-APAP-Caff-Cod (FIORICET/CODEINE) 50-300-40-30 MG CAPS Take by mouth daily as needed.  . dofetilide (TIKOSYN) 250 MCG capsule Take 250 mcg by  mouth 2 (two) times daily.  . dorzolamide-timolol (COSOPT) 22.3-6.8 MG/ML ophthalmic solution Place 1 drop into both eyes 2 (two) times daily.  . DULoxetine (CYMBALTA) 30 MG capsule Take 60 mg by mouth daily.   . fluticasone (FLONASE) 50 MCG/ACT nasal spray Place 2 sprays into the nose daily.  . Fluticasone-Salmeterol (ADVAIR DISKUS) 250-50 MCG/DOSE AEPB Inhale 1 puff into the lungs 2 (two) times daily.  Marland Kitchen latanoprost (XALATAN) 0.005 % ophthalmic solution Place 1 drop into both eyes daily.  Marland Kitchen levocetirizine (XYZAL) 5 MG tablet Take 5 mg by mouth Wyatt evening.  Marland Kitchen lisinopril (PRINIVIL,ZESTRIL) 10  MG tablet Take 10 mg by mouth daily.  . metoprolol succinate (TOPROL-XL) 25 MG 24 hr tablet Take 25 mg by mouth daily.  . montelukast (SINGULAIR) 10 MG tablet Take 10 mg by mouth at bedtime.  . Multiple Vitamin (MULTIVITAMIN WITH MINERALS) TABS tablet Take 1 tablet by mouth daily.  Marland Kitchen nystatin (MYCOSTATIN) 100000 UNIT/ML suspension 44mL swish and spit tid  . omeprazole (PRILOSEC) 20 MG capsule Take 20 mg by mouth daily.  . Sodium Chloride-Sodium Bicarb (CLASSIC NETI POT SINUS WASH NA) Use as directed as needed  . [DISCONTINUED] nystatin (MYCOSTATIN) 100000 UNIT/ML suspension Take 5 mLs by mouth 4 (four) times daily as needed.   . fluconazole (DIFLUCAN) 100 MG tablet Take 1 tablet (100 mg total) by mouth daily as needed (Thrush, only take one per month).  . rivaroxaban (XARELTO) 20 MG TABS tablet Take 1 tablet (20 mg total) by mouth daily with supper.  . [DISCONTINUED] butalbital-acetaminophen-caffeine (FIORICET, ESGIC) 50-325-40 MG per tablet Take 1 tablet by mouth Wyatt 4 (four) hours as needed for headache.   No facility-administered encounter medications on file as of 12/13/2014.

## 2014-12-13 NOTE — Assessment & Plan Note (Signed)
This is a recurrent and persistent problem for her. She was once taking daily clotrimazole which led to liver function test abnormalities. She has thrush again on exam today. It is invariably due to the inhaled cortical steroid but unfortunately we have not been able to stop that medicine.  Plan: Use low-dose Diflucan (100 mg) no more than 1 time per month followed by a maintenance dose of nystatin swish and spit solution 3 times a day

## 2014-12-13 NOTE — Patient Instructions (Signed)
Keep taking the Advair twice a day as you're doing Use the fluconazole one pill, no more frequently than once a month Use nystatin swish and spit 3 times a day We will see you back in 4 months or sooner if needed

## 2014-12-13 NOTE — Assessment & Plan Note (Signed)
She has mild persistent asthma. This has been a stable interval for her. We have attempted stepdown therapy on several occasions in the past and she has never been able to tolerate this without an episode of bronchitis with prolonged wheezing afterwards. She does well with Advair twice a day.  Plan: Continue Advair twice a day

## 2014-12-15 ENCOUNTER — Ambulatory Visit: Payer: Medicare Other | Admitting: Pulmonary Disease

## 2015-02-23 ENCOUNTER — Other Ambulatory Visit: Payer: Self-pay | Admitting: Neurology

## 2015-02-23 DIAGNOSIS — R2689 Other abnormalities of gait and mobility: Secondary | ICD-10-CM

## 2015-02-23 DIAGNOSIS — G250 Essential tremor: Secondary | ICD-10-CM

## 2015-03-03 ENCOUNTER — Ambulatory Visit: Admission: RE | Admit: 2015-03-03 | Payer: Medicare Other | Source: Ambulatory Visit

## 2015-03-04 ENCOUNTER — Ambulatory Visit
Admission: RE | Admit: 2015-03-04 | Discharge: 2015-03-04 | Disposition: A | Discharge status: Home / Self Care | Payer: Medicare Other | Source: Ambulatory Visit | Attending: Neurology | Admitting: Neurology

## 2015-03-04 DIAGNOSIS — I639 Cerebral infarction, unspecified: Secondary | ICD-10-CM | POA: Insufficient documentation

## 2015-03-04 DIAGNOSIS — G250 Essential tremor: Secondary | ICD-10-CM

## 2015-03-04 DIAGNOSIS — R2689 Other abnormalities of gait and mobility: Secondary | ICD-10-CM

## 2015-03-14 ENCOUNTER — Other Ambulatory Visit: Payer: Self-pay

## 2015-03-14 MED ORDER — FLUTICASONE-SALMETEROL 250-50 MCG/DOSE IN AEPB: 1.0000 | INHALATION_SPRAY | Freq: Two times a day (BID) | RESPIRATORY_TRACT | Status: AC

## 2015-05-02 ENCOUNTER — Encounter: Payer: Self-pay | Admitting: Internal Medicine

## 2015-05-02 ENCOUNTER — Ambulatory Visit (INDEPENDENT_AMBULATORY_CARE_PROVIDER_SITE_OTHER): Payer: Medicare Other | Admitting: Internal Medicine

## 2015-05-02 VITALS — BP 124/68 | HR 67 | Ht 62.0 in | Wt 208.0 lb

## 2015-05-02 DIAGNOSIS — I480 Paroxysmal atrial fibrillation: Secondary | ICD-10-CM

## 2015-05-02 DIAGNOSIS — J4540 Moderate persistent asthma, uncomplicated: Secondary | ICD-10-CM

## 2015-05-02 NOTE — Progress Notes (Signed)
* [facility]     Assessment and Plan:  Asthma. -Her symptoms appear to be controlled with Advair, and albuterol when necessary. -I asked her to continue her current regimen and call us should she develop any difficulty breathing, which she occasionally does during the winter months.  Thrush. -She has developed recurrent thrush with Advair, though currently it is not active. We discussed switching over to Anoro, which does not have steroid in it. However, she feels most comfortable with the regimen that she is currently on which she knows appears to work for her. -Given her diagnosis of WPW in the past. She is hesitant to start any other inhaled medications which could make this worse. Therefore, she is willing to accept the possibility of occasional thrush.  Wolff-Parkinson-White.  Allergic rhinitis. -Continue fluticasone nasal spray, and nasal antihistamine (Astelin).  Date: 05/02/2015  MRN# [id number] Debbie Wyatt 09-13-1945   Debbie Wyatt is a 69 y.o. old female seen in follow up for chief complaint of  No chief complaint on file.    HPI:  Debbie Wyatt is a pleasant lady with asthma and allergic rhinitis who first came to the [facility] in 09/2011. 09/23/11 Simple spirometry: Ratio 80%, FEV1 2.36L 111% predicted  She has been maintained on Advair twice daily, attempts to step this down have been complicated by recurrent bronchitis. Her course is also being, complicated by development of thrush. She also has a history of allergic rhinitis, she has been on nasal steroid nasal antihistamines and regular, and an oral antihistamine in the past.  She tells me her symptoms are usually worse in the winter time. She has been using her advair twice daily and feels she she has been doing well with it.   Denies sinus symptoms, she is currently taking astelin and flonase nasal spray. She has dog and is in the bedroom.   CBC 03/06/15; no eosinophilia.    Medication:   Outpatient Encounter Prescriptions as of 05/02/2015  Medication Sig  . albuterol (PROAIR HFA) 108 (90 BASE) MCG/ACT inhaler Inhale 2 puffs into the lungs Wyatt 6 (six) hours as needed for wheezing (use 10 minutes before exercise).  Marland Kitchen atorvastatin (LIPITOR) 40 MG tablet Take 80 mg by mouth daily.   . Azelastine HCl (ASTEPRO) 0.15 % SOLN Place 1 puff into the nose daily.  . brimonidine (ALPHAGAN) 0.15 % ophthalmic solution Place 1 drop into both eyes 2 (two) times daily.  . Butalbital-APAP-Caff-Cod (FIORICET/CODEINE) 50-300-40-30 MG CAPS Take by mouth daily as needed.  . dofetilide (TIKOSYN) 250 MCG capsule Take 250 mcg by mouth 2 (two) times daily.  . dorzolamide-timolol (COSOPT) 22.3-6.8 MG/ML ophthalmic solution Place 1 drop into both eyes 2 (two) times daily.  . DULoxetine (CYMBALTA) 30 MG capsule Take 60 mg by mouth daily.   . fluticasone (FLONASE) 50 MCG/ACT nasal spray Place 2 sprays into the nose daily.  . Fluticasone-Salmeterol (ADVAIR DISKUS) 250-50 MCG/DOSE AEPB Inhale 1 puff into the lungs 2 (two) times daily.  Marland Kitchen latanoprost (XALATAN) 0.005 % ophthalmic solution Place 1 drop into both eyes daily.  Marland Kitchen levocetirizine (XYZAL) 5 MG tablet Take 5 mg by mouth Wyatt evening.  Marland Kitchen lisinopril (PRINIVIL,ZESTRIL) 10 MG tablet Take 10 mg by mouth daily.  . metoprolol succinate (TOPROL-XL) 25 MG 24 hr tablet Take 25 mg by mouth daily.  . montelukast (SINGULAIR) 10 MG tablet Take 10 mg by mouth at bedtime.  . Multiple Vitamin (MULTIVITAMIN WITH MINERALS) TABS tablet Take 1 tablet by mouth daily.  Marland Kitchen  nystatin (MYCOSTATIN) 100000 UNIT/ML suspension 10mL swish and spit tid  . omeprazole (PRILOSEC) 20 MG capsule Take 20 mg by mouth daily.  . rivaroxaban (XARELTO) 20 MG TABS tablet Take 1 tablet (20 mg total) by mouth daily with supper.  . Sodium Chloride-Sodium Bicarb (CLASSIC NETI POT SINUS [place] NA) Use as directed as needed   No facility-administered encounter medications on file as of  05/02/2015.     Allergies:  Augmentin; Percocet; and Bextra  Review of Systems: Gen:  Denies  fever, sweats. HEENT: Denies blurred vision. Cvc:  No dizziness, chest pain or heaviness Resp:   Denies cough or sputum porduction. Gi: Denies swallowing difficulty, stomach pain. constipation, bowel incontinence Gu:  Denies bladder incontinence, burning urine Ext:   No Joint pain, stiffness. Skin: No skin rash, easy bruising. Endoc:  No polyuria, polydipsia. Psych: No depression, insomnia. Other:  All other systems were reviewed and found to be negative other than what is mentioned in the HPI.   Physical Examination:   VS: There were no vitals taken for this visit.  General Appearance: No distress  Neuro:without focal findings,  speech normal,  HEENT: PERRLA, EOM intact. Pulmonary: normal breath sounds, No wheezing.   CardiovascularNormal S1,S2.  No m/r/g.   Abdomen: Benign, Soft, non-tender. Renal:  No costovertebral tenderness  GU:  Not performed at this time. Endoc: No evident thyromegaly, no signs of acromegaly. Skin:   warm, no rash. Extremities: normal, no cyanosis, clubbing.   LABORATORY PANEL:   CBC No results for input(s): WBC, HGB, HCT, PLT in the last 168 hours. ------------------------------------------------------------------------------------------------------------------  Chemistries  No results for input(s): NA, K, CL, CO2, GLUCOSE, BUN, CREATININE, CALCIUM, MG, AST, ALT, ALKPHOS, BILITOT in the last 168 hours.  Invalid input(s): GFRCGP ------------------------------------------------------------------------------------------------------------------  Cardiac Enzymes No results for input(s): TROPONINI in the last 168 hours. ------------------------------------------------------------  RADIOLOGY:   No results found for this or any previous visit. Results for orders placed during the hospital encounter of 09/25/11  DG Chest 2 View   Narrative *RADIOLOGY  REPORT*  Clinical Data: Mild  CHEST - 2 VIEW  Comparison: None.  Findings: The lungs are clear.  Mediastinal contours appear normal. The heart is mildly enlarged.  No bony abnormality is seen.  IMPRESSION: Borderline cardiomegaly.  No active lung disease.  Original Report Authenticated By: Joretta Bachelor, M.D.   ------------------------------------------------------------------------------------------------------------------  Thank  you for allowing [facility] to assist in the care of your patient. Our recommendations are noted above.  Please contact us if we can be of further service.   Marda Stalker, MD.  [facility] Office Number: [phone]  Patricia Pesa, M.D.  Vilinda Boehringer, M.D.  Merton Border, M.D

## 2015-05-02 NOTE — Patient Instructions (Addendum)
--  Recommend that all pets be kept out of the bedroom.  --Continue inhaled medications and nasal spray.  --Alpha-1 testing today.

## 2015-11-15 ENCOUNTER — Ambulatory Visit: Payer: Medicare Other | Admitting: Internal Medicine

## 2015-11-20 ENCOUNTER — Ambulatory Visit (INDEPENDENT_AMBULATORY_CARE_PROVIDER_SITE_OTHER): Payer: Medicare Other | Admitting: Internal Medicine

## 2015-11-20 ENCOUNTER — Encounter: Payer: Self-pay | Admitting: Internal Medicine

## 2015-11-20 VITALS — BP 130/78 | HR 89 | Ht 62.0 in | Wt 213.0 lb

## 2015-11-20 DIAGNOSIS — J4540 Moderate persistent asthma, uncomplicated: Secondary | ICD-10-CM | POA: Diagnosis not present

## 2015-11-20 NOTE — Patient Instructions (Signed)
--  continue advair and singulair.

## 2015-11-20 NOTE — Progress Notes (Signed)
* [facility]     Assessment and Plan:  Asthma. -Her symptoms appear to be controlled with Advair, and albuterol when necessary. -I asked her to continue her current regimen and call us should she develop any difficulty breathing, which she occasionally does during the winter months.  Thrush. -She has developed recurrent thrush with Advair, though currently it is not active. We discussed switching over to Anoro, which does not have steroid in it. However, she feels most comfortable with the regimen that she is currently on which she knows appears to work for her. -Given her diagnosis of Debbie Wyatt in the past. She is hesitant to start any other inhaled medications which could make this worse. Therefore, she is willing to accept the possibility of occasional thrush.  Debbie Wyatt.  Allergic rhinitis. -Continue fluticasone nasal spray, and nasal antihistamine (Astelin).  Date: 11/20/2015  MRN# [id number] Debbie Wyatt 1946-04-03   Debbie Wyatt is a 70 y.o. old female seen in follow up for chief complaint of  Chief Complaint  Patient presents with  . Follow-up    pt states breathing is doing well. no new concerns today.      HPI:  [name] is a pleasant lady with asthma and allergic rhinitis who first came to the [facility] in 09/2011. 09/23/11 Simple spirometry: Ratio 80%, FEV1 2.36L 111% predicted  She has been maintained on Advair twice daily, attempts to step this down have been complicated by recurrent bronchitis. Her course is also being, complicated by development of thrush.  Last visit, she appeared to be controlled with Advair, therefore, this was continued. She has a history of Debbie Wyatt syndrome. Last visit, she was also started on Flonase and Astelin or allergic rhinitis. She had a history of thrush in the past, at last visit, we discussed changing from Advair to [facility], but she seemed the most comfortable with Advair and wanted to continue it.    Today she notes that her breathing has been doing well, she has no asthma flare ups, she notes not dyspnea on exertion. Her main limiting factor is arthritis. She has not used her rescue inhaler in the past week. She continues to take singulair.    CBC 03/06/15; no eosinophilia.   Medication:   Outpatient Encounter Prescriptions as of 11/20/2015  Medication Sig  . albuterol (PROAIR HFA) 108 (90 BASE) MCG/ACT inhaler Inhale 2 puffs into the lungs Wyatt 6 (six) hours as needed for wheezing (use 10 minutes before exercise).  Marland Kitchen atorvastatin (LIPITOR) 40 MG tablet Take 80 mg by mouth daily.   . Azelastine HCl (ASTEPRO) 0.15 % SOLN Place 1 puff into the nose daily.  . brimonidine (ALPHAGAN) 0.15 % ophthalmic solution Place 1 drop into both eyes 3 (three) times daily.   . Butalbital-APAP-Caff-Cod (FIORICET/CODEINE) 50-300-40-30 MG CAPS Take by mouth daily as needed.  . dofetilide (TIKOSYN) 250 MCG capsule Take 250 mcg by mouth 2 (two) times daily.  . dorzolamide-timolol (COSOPT) 22.3-6.8 MG/ML ophthalmic solution Place 1 drop into both eyes 3 (three) times daily.   . DULoxetine (CYMBALTA) 30 MG capsule Take 60 mg by mouth daily.   . fluticasone (FLONASE) 50 MCG/ACT nasal spray Place 2 sprays into the nose daily.  . Fluticasone-Salmeterol (ADVAIR DISKUS) 250-50 MCG/DOSE AEPB Inhale 1 puff into the lungs 2 (two) times daily.  Marland Kitchen latanoprost (XALATAN) 0.005 % ophthalmic solution Place 1 drop into both eyes daily.  Marland Kitchen levocetirizine (XYZAL) 5 MG tablet Take 5 mg by mouth Wyatt evening.  Marland Kitchen  lisinopril (PRINIVIL,ZESTRIL) 10 MG tablet Take 10 mg by mouth daily.  . metoprolol succinate (TOPROL-XL) 25 MG 24 hr tablet Take 25 mg by mouth daily.  . montelukast (SINGULAIR) 10 MG tablet Take 10 mg by mouth at bedtime.  . Multiple Vitamin (MULTIVITAMIN WITH MINERALS) TABS tablet Take 1 tablet by mouth daily.  Marland Kitchen nystatin (MYCOSTATIN) 100000 UNIT/ML suspension 72mL swish and spit tid  . omeprazole (PRILOSEC) 20 MG  capsule Take 20 mg by mouth daily.  . rivaroxaban (XARELTO) 20 MG TABS tablet Take 1 tablet (20 mg total) by mouth daily with supper.  . Sodium Chloride-Sodium Bicarb (CLASSIC NETI POT SINUS [place] NA) Use as directed as needed   No facility-administered encounter medications on file as of 11/20/2015.     Allergies:  Augmentin; Percocet; and Bextra  Review of Systems: Gen:  Denies  fever, sweats. HEENT: Denies blurred vision. Cvc:  No dizziness, chest pain or heaviness Resp:   Denies cough or sputum porduction. Gi: Denies swallowing difficulty, stomach pain. constipation, bowel incontinence Gu:  Denies bladder incontinence, burning urine Ext:   No Joint pain, stiffness. Skin: No skin rash, easy bruising. Endoc:  No polyuria, polydipsia. Psych: No depression, insomnia. Other:  All other systems were reviewed and found to be negative other than what is mentioned in the HPI.   Physical Examination:   VS: BP 130/78 mmHg  Pulse 89  Ht 5\' 2"  (1.575 m)  Wt 213 lb (96.616 kg)  BMI 38.95 kg/m2  SpO2 96%  General Appearance: No distress  Neuro:without focal findings,  speech normal,  HEENT: PERRLA, EOM intact. Pulmonary: normal breath sounds, No wheezing.   CardiovascularNormal S1,S2.  No m/r/g.   Abdomen: Benign, Soft, non-tender. Renal:  No costovertebral tenderness  GU:  Not performed at this time. Endoc: No evident thyromegaly, no signs of acromegaly. Skin:   warm, no rash. Extremities: normal, no cyanosis, clubbing.   LABORATORY PANEL:   CBC No results for input(s): WBC, HGB, HCT, PLT in the last 168 hours. ------------------------------------------------------------------------------------------------------------------  Chemistries  No results for input(s): NA, K, CL, CO2, GLUCOSE, BUN, CREATININE, CALCIUM, MG, AST, ALT, ALKPHOS, BILITOT in the last 168 hours.  Invalid input(s):  GFRCGP ------------------------------------------------------------------------------------------------------------------  Cardiac Enzymes No results for input(s): TROPONINI in the last 168 hours. ------------------------------------------------------------  RADIOLOGY:   No results found for this or any previous visit. Results for orders placed during the hospital encounter of 09/25/11  DG Chest 2 View   Narrative *RADIOLOGY REPORT*  Clinical Data: Mild  CHEST - 2 VIEW  Comparison: None.  Findings: The lungs are clear.  Mediastinal contours appear normal. The heart is mildly enlarged.  No bony abnormality is seen.  IMPRESSION: Borderline cardiomegaly.  No active lung disease.  Original Report Authenticated By: Joretta Bachelor, M.D.   ------------------------------------------------------------------------------------------------------------------  Thank  you for allowing [facility] to assist in the care of your patient. Our recommendations are noted above.  Please contact us if we can be of further service.   Marda Stalker, MD.  [facility] Office Number: [phone]  Patricia Pesa, M.D.  Vilinda Boehringer, M.D.  Merton Border, M.D

## 2016-02-13 ENCOUNTER — Telehealth: Payer: Self-pay | Admitting: Internal Medicine

## 2016-02-13 NOTE — Telephone Encounter (Signed)
No New Rx has been sent to pharmacy. No more refills left from [facility].  Pt requesting meds to be called in to [facility] in [place]. Rhonda J Cobb  Usually Rx is for 3 tabs per month. Rhonda J Cobb

## 2016-02-13 NOTE — Telephone Encounter (Signed)
lmtcb X1 for pt. As pt is now being followed by PR he will need to address this refill request.  PR please advise on fluconazole refill.  Thanks!

## 2016-02-14 MED ORDER — FLUCONAZOLE 100 MG PO TABS: 100.0000 mg | ORAL_TABLET | Freq: Every day | ORAL | 3 refills | Status: DC | PRN

## 2016-02-14 NOTE — Telephone Encounter (Signed)
rx refill sent to preferred pharmacy. Pt aware.  Nothing further needed.

## 2016-02-14 NOTE — Telephone Encounter (Signed)
May take fluconazole 100 mg daily for 3 days, may refill x 3.

## 2016-08-09 ENCOUNTER — Other Ambulatory Visit: Payer: Self-pay | Admitting: Otolaryngology

## 2016-08-09 DIAGNOSIS — R42 Dizziness and giddiness: Secondary | ICD-10-CM

## 2016-08-22 ENCOUNTER — Ambulatory Visit
Admission: RE | Admit: 2016-08-22 | Discharge: 2016-08-22 | Disposition: A | Discharge status: Home / Self Care | Payer: Medicare Other | Source: Ambulatory Visit | Attending: Otolaryngology | Admitting: Otolaryngology

## 2016-08-22 DIAGNOSIS — I639 Cerebral infarction, unspecified: Secondary | ICD-10-CM | POA: Diagnosis not present

## 2016-08-22 DIAGNOSIS — Z8673 Personal history of transient ischemic attack (TIA), and cerebral infarction without residual deficits: Secondary | ICD-10-CM | POA: Insufficient documentation

## 2016-08-22 DIAGNOSIS — R42 Dizziness and giddiness: Secondary | ICD-10-CM | POA: Diagnosis present

## 2016-08-22 MED ORDER — GADOBENATE DIMEGLUMINE 529 MG/ML IV SOLN
20.0000 mL | Freq: Once | INTRAVENOUS | Status: AC | PRN
  Administered 2016-08-22: 20 mL via INTRAVENOUS

## 2016-08-28 ENCOUNTER — Other Ambulatory Visit: Payer: Self-pay | Admitting: Otolaryngology

## 2016-08-28 DIAGNOSIS — R42 Dizziness and giddiness: Secondary | ICD-10-CM

## 2016-09-03 ENCOUNTER — Ambulatory Visit
Admission: RE | Admit: 2016-09-03 | Discharge: 2016-09-03 | Disposition: A | Discharge status: Home / Self Care | Payer: Medicare Other | Source: Ambulatory Visit | Attending: Otolaryngology | Admitting: Otolaryngology

## 2016-09-03 DIAGNOSIS — R42 Dizziness and giddiness: Secondary | ICD-10-CM | POA: Diagnosis present

## 2016-09-03 DIAGNOSIS — I6523 Occlusion and stenosis of bilateral carotid arteries: Secondary | ICD-10-CM | POA: Diagnosis not present

## 2017-10-19 ENCOUNTER — Other Ambulatory Visit: Payer: Self-pay

## 2017-10-19 ENCOUNTER — Ambulatory Visit
Admission: EM | Admit: 2017-10-19 | Discharge: 2017-10-19 | Disposition: A | Discharge status: Home / Self Care | Payer: Medicare Other | Attending: Emergency Medicine | Admitting: Emergency Medicine

## 2017-10-19 ENCOUNTER — Ambulatory Visit: Payer: Medicare Other

## 2017-10-19 DIAGNOSIS — Z7951 Long term (current) use of inhaled steroids: Secondary | ICD-10-CM | POA: Insufficient documentation

## 2017-10-19 DIAGNOSIS — J45909 Unspecified asthma, uncomplicated: Secondary | ICD-10-CM | POA: Insufficient documentation

## 2017-10-19 DIAGNOSIS — Z85828 Personal history of other malignant neoplasm of skin: Secondary | ICD-10-CM | POA: Insufficient documentation

## 2017-10-19 DIAGNOSIS — W1831XA Fall on same level due to stepping on an object, initial encounter: Secondary | ICD-10-CM | POA: Insufficient documentation

## 2017-10-19 DIAGNOSIS — Y92480 Sidewalk as the place of occurrence of the external cause: Secondary | ICD-10-CM | POA: Insufficient documentation

## 2017-10-19 DIAGNOSIS — Z812 Family history of tobacco abuse and dependence: Secondary | ICD-10-CM | POA: Diagnosis not present

## 2017-10-19 DIAGNOSIS — Z7901 Long term (current) use of anticoagulants: Secondary | ICD-10-CM | POA: Insufficient documentation

## 2017-10-19 DIAGNOSIS — Z9889 Other specified postprocedural states: Secondary | ICD-10-CM | POA: Diagnosis not present

## 2017-10-19 DIAGNOSIS — H409 Unspecified glaucoma: Secondary | ICD-10-CM | POA: Insufficient documentation

## 2017-10-19 DIAGNOSIS — Z836 Family history of other diseases of the respiratory system: Secondary | ICD-10-CM | POA: Insufficient documentation

## 2017-10-19 DIAGNOSIS — Z79899 Other long term (current) drug therapy: Secondary | ICD-10-CM | POA: Diagnosis not present

## 2017-10-19 DIAGNOSIS — Z79891 Long term (current) use of opiate analgesic: Secondary | ICD-10-CM | POA: Insufficient documentation

## 2017-10-19 DIAGNOSIS — Z885 Allergy status to narcotic agent status: Secondary | ICD-10-CM | POA: Diagnosis not present

## 2017-10-19 DIAGNOSIS — I456 Pre-excitation syndrome: Secondary | ICD-10-CM | POA: Insufficient documentation

## 2017-10-19 DIAGNOSIS — J310 Chronic rhinitis: Secondary | ICD-10-CM | POA: Diagnosis not present

## 2017-10-19 DIAGNOSIS — Z8249 Family history of ischemic heart disease and other diseases of the circulatory system: Secondary | ICD-10-CM | POA: Insufficient documentation

## 2017-10-19 DIAGNOSIS — J069 Acute upper respiratory infection, unspecified: Secondary | ICD-10-CM | POA: Diagnosis not present

## 2017-10-19 DIAGNOSIS — Z9049 Acquired absence of other specified parts of digestive tract: Secondary | ICD-10-CM | POA: Insufficient documentation

## 2017-10-19 DIAGNOSIS — S8010XA Contusion of unspecified lower leg, initial encounter: Secondary | ICD-10-CM | POA: Insufficient documentation

## 2017-10-19 DIAGNOSIS — W1809XA Striking against other object with subsequent fall, initial encounter: Secondary | ICD-10-CM | POA: Diagnosis not present

## 2017-10-19 DIAGNOSIS — B379 Candidiasis, unspecified: Secondary | ICD-10-CM | POA: Diagnosis not present

## 2017-10-19 DIAGNOSIS — Z888 Allergy status to other drugs, medicaments and biological substances status: Secondary | ICD-10-CM | POA: Diagnosis not present

## 2017-10-19 DIAGNOSIS — E785 Hyperlipidemia, unspecified: Secondary | ICD-10-CM | POA: Insufficient documentation

## 2017-10-19 DIAGNOSIS — Z7982 Long term (current) use of aspirin: Secondary | ICD-10-CM | POA: Diagnosis not present

## 2017-10-19 DIAGNOSIS — M79661 Pain in right lower leg: Secondary | ICD-10-CM | POA: Diagnosis present

## 2017-10-19 DIAGNOSIS — M79662 Pain in left lower leg: Secondary | ICD-10-CM | POA: Diagnosis present

## 2017-10-19 DIAGNOSIS — Z880 Allergy status to penicillin: Secondary | ICD-10-CM | POA: Diagnosis not present

## 2017-10-19 DIAGNOSIS — K219 Gastro-esophageal reflux disease without esophagitis: Secondary | ICD-10-CM | POA: Insufficient documentation

## 2017-10-19 DIAGNOSIS — Z8673 Personal history of transient ischemic attack (TIA), and cerebral infarction without residual deficits: Secondary | ICD-10-CM | POA: Diagnosis not present

## 2017-10-19 MED ORDER — MUPIROCIN 2 % EX OINT: 1.0000 "application " | TOPICAL_OINTMENT | Freq: Three times a day (TID) | CUTANEOUS | 0 refills | Status: DC

## 2017-10-19 MED ORDER — HYDROCODONE-ACETAMINOPHEN 5-325 MG PO TABS: 1.0000 | ORAL_TABLET | Freq: Four times a day (QID) | ORAL | 0 refills | Status: AC | PRN

## 2017-10-19 NOTE — ED Triage Notes (Signed)
Pt reports falling onto a curb onto her shins on Monday. Bruising noted and very sore.

## 2017-10-19 NOTE — ED Provider Notes (Signed)
[facility]    CSN: [id number] Arrival date & time: 10/19/17  0801     History   Chief Complaint Chief Complaint  Patient presents with  . Leg Pain    HPI Debbie Wyatt is a 72 y.o. female.   HPI  72 year old female presents with bilateral shin injuries.  She states that 6 days ago was at the pool exited the pool and walked on wet flip-flops; while approaching her car the flip-flop toe curled causing her to trip and she landed on a concrete curb with full weight on both of her shins.   SinceThat time she has been using ice and elevation along with Tylenol.  She continues to have pain.  The right leg is more swollen and ecchymotic than the left.  Has a small abrasion over the right anterior shin that has no evidence of infection or drainage.  He is able to walk full weightbearing.  He had no fever or chills.         Past Medical History:  Diagnosis Date  . Asthma   . Glaucoma   . Hyperlipidemia   . Skin cancer   . TIA (transient ischemic attack)   . WPW (Wolff-Parkinson-White syndrome)     Patient Active Problem List   Diagnosis Date Noted  . Thrush 08/31/2014  . URI (upper respiratory infection) 09/09/2012  . GERD (gastroesophageal reflux disease) 09/24/2011  . Rhinitis, chronic 09/24/2011  . Asthma   . Hyperlipidemia   . WPW (Wolff-Parkinson-White syndrome)     Past Surgical History:  Procedure Laterality Date  . BACK SURGERY     x 2  . cataract surgery    . CHOLECYSTECTOMY  1991  . SEGMENTECOMY      OB History   None      Home Medications    Prior to Admission medications   Medication Sig Start Date End Date Taking? Authorizing Provider  acetaminophen-codeine (TYLENOL #3) 300-30 MG tablet One by mouth for migraine qd 05/05/17  Yes [provider]  apixaban (ELIQUIS) 5 MG TABS tablet Take by mouth. 02/26/17  Yes [provider]  Fluticasone-Salmeterol (ADVAIR DISKUS) 250-50 MCG/DOSE AEPB INHALE 1 PUFF BY MOUTH  TWICE A DAY 04/02/17  Yes [provider]  albuterol (PROAIR HFA) 108 (90 BASE) MCG/ACT inhaler Inhale 2 puffs into the lungs every 6 (six) hours as needed for wheezing (use 10 minutes before exercise). 11/03/12   Juanito Doom, MD  aspirin EC 81 MG tablet Take by mouth.    [provider]  atorvastatin (LIPITOR) 40 MG tablet Take 80 mg by mouth daily.     [provider]  Azelastine HCl (ASTEPRO) 0.15 % SOLN Place 1 puff into the nose daily.    [provider]  brimonidine (ALPHAGAN) 0.15 % ophthalmic solution Place 1 drop into both eyes 3 (three) times daily.     [provider]  Butalbital-APAP-Caff-Cod (FIORICET/CODEINE) 50-300-40-30 MG CAPS Take by mouth daily as needed.    [provider]  dofetilide (TIKOSYN) 250 MCG capsule Take 250 mcg by mouth 2 (two) times daily.    [provider]  dorzolamide-timolol (COSOPT) 22.3-6.8 MG/ML ophthalmic solution Place 1 drop into both eyes 3 (three) times daily.     [provider]  DULoxetine (CYMBALTA) 30 MG capsule Take 60 mg by mouth daily.     [provider]  HYDROcodone-acetaminophen (NORCO/VICODIN) 5-325 MG tablet Take 1 tablet by mouth every 6 (six) hours as needed for  up to 3 days for moderate pain. 10/19/17 10/22/17  Lorin Picket, PA-C  latanoprost (XALATAN) 0.005 % ophthalmic solution Place 1 drop into both eyes daily.    [provider]  levocetirizine (XYZAL) 5 MG tablet Take 5 mg by mouth every evening.    [provider]  lisinopril (PRINIVIL,ZESTRIL) 10 MG tablet Take 10 mg by mouth daily.    [provider]  metoprolol succinate (TOPROL-XL) 25 MG 24 hr tablet Take 25 mg by mouth daily.    [provider]  montelukast (SINGULAIR) 10 MG tablet Take 10 mg by mouth at bedtime.    [provider]  Multiple Vitamin (MULTIVITAMIN WITH MINERALS) TABS tablet Take 1 tablet by mouth daily.    [provider]    mupirocin ointment (BACTROBAN) 2 % Apply 1 application topically 3 (three) times daily. 10/19/17   Lorin Picket, PA-C  nystatin (MYCOSTATIN) 100000 UNIT/ML suspension 27mL swish and spit tid 12/13/14   Juanito Doom, MD  omeprazole (PRILOSEC) 20 MG capsule Take 20 mg by mouth daily.    [provider]  PARoxetine (PAXIL) 40 MG tablet Take 40 mg by mouth daily. 10/05/17   [provider]  Potassium 99 MG TABS Take by mouth.    [provider]  rivaroxaban (XARELTO) 20 MG TABS tablet Take 1 tablet (20 mg total) by mouth daily with supper. 12/13/14   Juanito Doom, MD  Sodium Chloride-Sodium Bicarb (CLASSIC NETI POT SINUS [place] NA) Use as directed as needed    [provider]    Family History Family History  Problem Relation Age of Onset  . Heart disease Mother   . Emphysema Father        smoked and was a Nurse, learning disability    Social History Social History   Tobacco Use  . Smoking status: Never Smoker  . Smokeless tobacco: Never Used  Substance Use Topics  . Alcohol use: No  . Drug use: No     Allergies   Augmentin [amoxicillin-pot clavulanate]; Percocet [oxycodone-acetaminophen]; and Bextra [valdecoxib]   Review of Systems Review of Systems  Constitutional: Positive for activity change. Negative for appetite change, chills, diaphoresis, fatigue and fever.  Musculoskeletal: Positive for gait problem.  Skin: Positive for color change.  All other systems reviewed and are negative.    Physical Exam Triage Vital Signs ED Triage Vitals  Enc Vitals Group     BP 10/19/17 0817 (!) 134/56     Pulse Rate 10/19/17 0817 65     Resp 10/19/17 0817 20     Temp 10/19/17 0817 98.4 F (36.9 C)     Temp Source 10/19/17 0817 Oral     SpO2 10/19/17 0817 98 %     Weight 10/19/17 0815 215 lb (97.5 kg)     Height 10/19/17 0815 5\' 2"  (1.575 m)     Head Circumference --      Peak Flow --      Pain Score 10/19/17 0814 6     Pain Loc --      Pain  Edu? --      Excl. in [place]? --    No data found.  Updated Vital Signs BP (!) 134/56 (BP Location: Left Arm)   Pulse 65   Temp 98.4 F (36.9 C) (Oral)   Resp 20   Ht 5\' 2"  (1.575 m)   Wt 215 lb (97.5 kg)   SpO2 98%   BMI 39.32 kg/m   Visual  Acuity Right Eye Distance:   Left Eye Distance:   Bilateral Distance:    Right Eye Near:   Left Eye Near:    Bilateral Near:     Physical Exam  Constitutional: She is oriented to person, place, and time. She appears well-developed and well-nourished. No distress.  HENT:  Head: Normocephalic.  Eyes: Pupils are equal, round, and reactive to light. Right eye exhibits no discharge. Left eye exhibits no discharge.  Neck: Normal range of motion.  Musculoskeletal: Normal range of motion. She exhibits edema and tenderness.  Examination of the bilateral lower legs shows on the right ecchymosis and swelling standing from the shaft to the juncture of the mid and distal thirds.  The patient has a small abrasion over the midportion of the ecchymotic area but without any evidence of infection or drainage.  Is extremely tender under the ecchymotic area.  Ankle range of motion is full and fairly comfortable.  These are soft in the calf however leg just lateral to the anterior tibial spine remains a small hematoma that is soft.  Leg shows much less ecchymosis.  He will has significant tenderness over the area of the ecchymotic area.  There are no breaks in the skin on the left.   Maximum calf circumference shows 39-1/2 cm on the right and 39 cm on the left  Neurological: She is alert and oriented to person, place, and time.  Skin: Skin is warm and dry. She is not diaphoretic.  Psychiatric: She has a normal mood and affect. Her behavior is normal. Judgment and thought content normal.  Nursing note and vitals reviewed.    UC Treatments / Results  Labs (all labs ordered are listed, but only abnormal results are displayed) Labs Reviewed - No data to  display  EKG None  Radiology Dg Tibia/fibula Left  Result Date: 10/19/2017 CLINICAL DATA:  Status post fall 6 days ago with pain of bilateral lower extremities. EXAM: LEFT TIBIA AND FIBULA - 2 VIEW COMPARISON:  None. FINDINGS: There is no evidence of fracture or dislocation. Soft tissues are unremarkable. IMPRESSION: Negative. Electronically Signed   By: Abelardo Diesel M.D.   On: 10/19/2017 09:01   Dg Tibia/fibula Right  Result Date: 10/19/2017 CLINICAL DATA:  Status post fall 6 days ago with pain of bilateral lower extremities. EXAM: RIGHT TIBIA AND FIBULA - 2 VIEW COMPARISON:  None. FINDINGS: There is no evidence of fracture or dislocation. Soft tissues are unremarkable. IMPRESSION: Negative. Electronically Signed   By: Abelardo Diesel M.D.   On: 10/19/2017 09:01    Procedures Procedures (including critical care time)  Medications Ordered in UC Medications - No data to display  Initial Impression / Assessment and Plan / UC Course  I have reviewed the triage vital signs and the nursing notes.  Pertinent labs & imaging results that were available during my care of the patient were reviewed by me and considered in my medical decision making (see chart for details).     Plan: 1. Test/x-ray results and diagnosis reviewed with patient 2. rx as per orders; risks, benefits, potential side effects reviewed with patient 3. Recommend supportive treatment with ice and elevation to reduce swelling and pain.Use Bactrim  Ointment on the abrasions to prevent secondary infection.  If you are not improving follow-up with orthopedic surgery.  I will provide her a short supply of Vicodin because she states that the pain is very severe at nighttime interfering with her sleep. 4. F/u prn if symptoms worsen or  don't improve  Final Clinical Impressions(s) / UC Diagnoses   Final diagnoses:  Contusion of lower leg, unspecified laterality, initial encounter     Discharge Instructions     Apply ice 20  minutes out of every 2 hours 4-5 times daily for comfort.     ED Prescriptions    Medication Sig Dispense Auth. Provider   mupirocin ointment (BACTROBAN) 2 % Apply 1 application topically 3 (three) times daily. 22 g Lorin Picket, PA-C   HYDROcodone-acetaminophen (NORCO/VICODIN) 5-325 MG tablet Take 1 tablet by mouth every 6 (six) hours as needed for up to 3 days for moderate pain. 10 tablet Lorin Picket, PA-C     Controlled Substance Prescriptions [place] Controlled Substance Registry consulted? [place] substance abuse registry was accessed.  Patient has received 4 prescriptions for narcotic medications very short supplies 3 different physicians.. He did receive 1 prescription of tramadol 90 pills in July 2018.   Lorin Picket, PA-C 10/19/17 1210

## 2017-10-19 NOTE — Discharge Instructions (Signed)
Apply ice 20 minutes out of every 2 hours 4-5 times daily for comfort.  °

## 2017-10-19 NOTE — ED Notes (Signed)
Ice pack to right lower leg

## 2017-11-06 ENCOUNTER — Other Ambulatory Visit: Payer: Self-pay | Admitting: Internal Medicine

## 2017-11-06 DIAGNOSIS — Z1239 Encounter for other screening for malignant neoplasm of breast: Secondary | ICD-10-CM

## 2018-03-10 ENCOUNTER — Encounter: Payer: Self-pay | Admitting: Dietician

## 2018-03-10 ENCOUNTER — Encounter: Payer: Medicare Other | Attending: Family Medicine | Admitting: Dietician

## 2018-03-10 VITALS — BP 114/66 | Ht 62.0 in | Wt 215.8 lb

## 2018-03-10 DIAGNOSIS — Z713 Dietary counseling and surveillance: Secondary | ICD-10-CM | POA: Insufficient documentation

## 2018-03-10 DIAGNOSIS — E119 Type 2 diabetes mellitus without complications: Secondary | ICD-10-CM | POA: Insufficient documentation

## 2018-03-10 NOTE — Patient Instructions (Addendum)
  Check blood sugars 2 x day before breakfast and 2 hrs after lunch or supper every day and record  Bring blood sugar records to the next appointment/class  Exercise:  Continue to swim/water aerobics 1.5-2 hr 5x/wk  Eat 3 meals day,  1-2  snacks a day at bedtime and/or  afternoon Space meals 4-6 hours apart  Eat 2-3 carbohydrate servings/meal + protein  Eat 1 carbohydrate serving/snack + protein  Continue to drink plenty of water  Avoid sugar sweetened drinks (soda, tea, coffee, sports drinks, juices)  Limit intake of sweets/desserts, snack foods and fried foods  Get a Sharps container  Return for appointment/classes on:  03-26-18

## 2018-03-10 NOTE — Progress Notes (Signed)
Diabetes Self-Management Education  Visit Type: First/Initial  Appt. Start Time: 1315 Appt. End Time: 1430  03/10/2018  [name], identified by name and date of birth, is a 72 y.o. female with a diagnosis of Diabetes: Type 2.   ASSESSMENT  Blood pressure 114/66, height 5\' 2"  (1.575 m), weight 215 lb 12.8 oz (97.9 kg). Body mass index is 39.47 kg/m.  Diabetes Self-Management Education - 03/10/18 1451      Visit Information   Visit Type  First/Initial      Initial Visit   Diabetes Type  Type 2      Health Coping   How would you rate your overall health?  Good      Psychosocial Assessment   Patient Belief/Attitude about Diabetes  Motivated to manage diabetes    Self-care barriers  None    Self-management support  Family;Doctor's office    Other persons present  Patient    Patient Concerns  Nutrition/Meal planning;Medication;Healthy Lifestyle;Glycemic Control;Weight Control   prevent complications   Special Needs  None    Preferred Learning Style  Hands on;Visual;Auditory    Learning Readiness  Ready    What is the last grade level you completed in school?  18      Pre-Education Assessment   Patient understands the diabetes disease and treatment process.  Needs Instruction    Patient understands incorporating nutritional management into lifestyle.  Needs Instruction    Patient undertands incorporating physical activity into lifestyle.  Needs Instruction    Patient understands using medications safely.  Needs Review    Patient understands monitoring blood glucose, interpreting and using results  Needs Review    Patient understands prevention, detection, and treatment of acute complications.  Needs Instruction    Patient understands prevention, detection, and treatment of chronic complications.  Needs Instruction    Patient understands how to develop strategies to address psychosocial issues.  Needs Instruction    Patient understands how to develop strategies to  promote health/change behavior.  Needs Instruction      Complications   Last HgB A1C per patient/outside source  6.9 %   02-18-18   How often do you check your blood sugar?  1-2 times/day    Fasting Blood glucose range (mg/dL)  130-179   130's   Have you had a dilated eye exam in the past 12 months?  Yes   <1 month ago   Have you had a dental exam in the past 12 months?  Yes   11-2017; appt 05-2018   Are you checking your feet?  Yes    How many days per week are you checking your feet?  7      Dietary Intake   Breakfast  eats breakfast at 7:30a    Snack (morning)  none    Lunch  eats lunch at 12:30p    Snack (afternoon)  eats cheese or nuts at 3p    Dinner  eats supper at 6p    Snack (evening)  eats cheese or spoonful of peanut butter  occasionally at 9p    Beverage(s)  drinks water 8+x/day and unsseet tea 6-7x/day      Exercise   Exercise Type  Moderate (swimming / aerobic walking)   swims, water aerobics    How many days per week to you exercise?  5    How many minutes per day do you exercise?  105    Total minutes per week of exercise  525  Patient Education   Previous Diabetes Education  No    Disease state   Definition of diabetes, type 1 and 2, and the diagnosis of diabetes;Factors that contribute to the development of diabetes    Nutrition management   Role of diet in the treatment of diabetes and the relationship between the three main macronutrients and blood glucose level;Carbohydrate counting;Food label reading, portion sizes and measuring food.    Physical activity and exercise   Role of exercise on diabetes management, blood pressure control and cardiac health.;Helped patient identify appropriate exercises in relation to his/her diabetes, diabetes complications and other health issue.    Medications  Reviewed patients medication for diabetes, action, purpose, timing of dose and side effects.    Monitoring  Purpose and frequency of SMBG.;Taught/discussed recording  of test results and interpretation of SMBG.;Identified appropriate SMBG and/or A1C goals.;Yearly dilated eye exam    Chronic complications  Relationship between chronic complications and blood glucose control;Dental care;Lipid levels, blood glucose control and heart disease;Nephropathy, what it is, prevention of, the use of ACE, ARB's and early detection of through urine microalbumia.;Retinopathy and reason for yearly dilated eye exams;Reviewed with patient heart disease, higher risk of, and prevention    Personal strategies to promote health  Lifestyle issues that need to be addressed for better diabetes care;Helped patient develop diabetes management plan for (enter comment)      Outcomes   Expected Outcomes  Demonstrated interest in learning. Expect positive outcomes       Individualized Plan for Diabetes Self-Management Training:   Learning Objective:  Patient will have a greater understanding of diabetes self-management. Patient education plan is to attend individual and/or group sessions per assessed needs and concerns.   Plan:   Patient Instructions   Check blood sugars 2 x day before breakfast and 2 hrs after lunch or supper every day and record  Bring blood sugar records to the next appointment/class  Exercise: Continue to swim/water aerobics 1.5-2 hr 5x/wk  Eat 3 meals day,  1-2  snacks a day at bedtime and afternoon if desires  Space meals 4-6 hours apart  Eat 2-3 carbohydrate servings/meal + protein  Eat 1 carbohydrate serving/snack + protein  Continue to drink plenty of water  Avoid sugar sweetened drinks (soda, tea, coffee, sports drinks, juices)  Limit intake of sweets/desserts, snack foods and fried foods  Get a Sharps container  Return for appointment/classes on:  03-26-18   Expected Outcomes:  Demonstrated interest in learning. Expect positive outcomes  Education material provided: General meal planning guidelines, Food Group handout  If problems or  questions, patient to contact team via:  [phone]  Future DSME appointment:  03-26-18

## 2018-03-26 ENCOUNTER — Encounter: Payer: Self-pay | Admitting: Dietician

## 2018-03-26 ENCOUNTER — Encounter: Payer: Medicare Other | Admitting: Dietician

## 2018-03-26 VITALS — Ht 62.0 in | Wt 215.7 lb

## 2018-03-26 DIAGNOSIS — E119 Type 2 diabetes mellitus without complications: Secondary | ICD-10-CM

## 2018-03-26 DIAGNOSIS — Z713 Dietary counseling and surveillance: Secondary | ICD-10-CM | POA: Diagnosis not present

## 2018-03-26 NOTE — Progress Notes (Signed)

## 2018-04-02 ENCOUNTER — Encounter: Payer: Self-pay | Admitting: *Deleted

## 2018-04-02 ENCOUNTER — Encounter: Payer: Medicare Other | Admitting: *Deleted

## 2018-04-02 VITALS — Wt 214.1 lb

## 2018-04-02 DIAGNOSIS — Z713 Dietary counseling and surveillance: Secondary | ICD-10-CM | POA: Diagnosis not present

## 2018-04-02 DIAGNOSIS — E119 Type 2 diabetes mellitus without complications: Secondary | ICD-10-CM

## 2018-04-02 NOTE — Progress Notes (Signed)

## 2018-04-09 ENCOUNTER — Encounter: Payer: Self-pay | Admitting: Dietician

## 2018-04-09 ENCOUNTER — Encounter: Payer: Medicare Other | Admitting: Dietician

## 2018-04-09 VITALS — BP 124/84 | Ht 62.0 in | Wt 214.9 lb

## 2018-04-09 DIAGNOSIS — E119 Type 2 diabetes mellitus without complications: Secondary | ICD-10-CM

## 2018-04-09 DIAGNOSIS — Z713 Dietary counseling and surveillance: Secondary | ICD-10-CM | POA: Diagnosis not present

## 2018-04-09 NOTE — Progress Notes (Signed)

## 2018-04-20 ENCOUNTER — Encounter: Payer: Self-pay | Admitting: Dietician

## 2020-07-10 ENCOUNTER — Inpatient Hospital Stay
Admission: RE | Admit: 2020-07-10 | Discharge: 2020-07-10 | Disposition: A | Discharge status: Home / Self Care | Payer: Self-pay | Source: Ambulatory Visit | Attending: *Deleted | Admitting: *Deleted

## 2020-07-10 ENCOUNTER — Other Ambulatory Visit: Payer: Self-pay | Admitting: *Deleted

## 2020-07-10 ENCOUNTER — Other Ambulatory Visit: Payer: Self-pay | Admitting: Family Medicine

## 2020-07-10 DIAGNOSIS — Z1231 Encounter for screening mammogram for malignant neoplasm of breast: Secondary | ICD-10-CM

## 2020-07-14 ENCOUNTER — Other Ambulatory Visit: Payer: Self-pay | Admitting: Family Medicine

## 2020-07-14 DIAGNOSIS — R112 Nausea with vomiting, unspecified: Secondary | ICD-10-CM

## 2020-07-14 DIAGNOSIS — R140 Abdominal distension (gaseous): Secondary | ICD-10-CM

## 2020-07-14 DIAGNOSIS — R109 Unspecified abdominal pain: Secondary | ICD-10-CM

## 2020-07-17 ENCOUNTER — Other Ambulatory Visit: Payer: Self-pay

## 2020-07-17 ENCOUNTER — Ambulatory Visit
Admission: RE | Admit: 2020-07-17 | Discharge: 2020-07-17 | Disposition: A | Discharge status: Home / Self Care | Payer: Medicare HMO | Source: Ambulatory Visit | Attending: Family Medicine | Admitting: Family Medicine

## 2020-07-17 DIAGNOSIS — R109 Unspecified abdominal pain: Secondary | ICD-10-CM | POA: Insufficient documentation

## 2020-07-17 DIAGNOSIS — R140 Abdominal distension (gaseous): Secondary | ICD-10-CM | POA: Diagnosis present

## 2020-07-17 DIAGNOSIS — R112 Nausea with vomiting, unspecified: Secondary | ICD-10-CM | POA: Diagnosis present

## 2020-07-17 HISTORY — DX: Essential (primary) hypertension: I10

## 2020-07-17 HISTORY — DX: Type 2 diabetes mellitus without complications: E11.9

## 2020-07-17 MED ORDER — IOHEXOL 300 MG/ML  SOLN
100.0000 mL | Freq: Once | INTRAMUSCULAR | Status: AC | PRN
  Administered 2020-07-17: 100 mL via INTRAVENOUS

## 2020-07-20 ENCOUNTER — Other Ambulatory Visit: Payer: Self-pay

## 2020-07-20 ENCOUNTER — Ambulatory Visit
Admission: RE | Admit: 2020-07-20 | Discharge: 2020-07-20 | Disposition: A | Discharge status: Home / Self Care | Payer: Medicare HMO | Source: Ambulatory Visit | Attending: Family Medicine | Admitting: Family Medicine

## 2020-07-20 DIAGNOSIS — Z1231 Encounter for screening mammogram for malignant neoplasm of breast: Secondary | ICD-10-CM | POA: Insufficient documentation

## 2020-09-29 ENCOUNTER — Other Ambulatory Visit: Admission: RE | Admit: 2020-09-29 | Payer: Medicare HMO | Source: Ambulatory Visit

## 2020-10-03 ENCOUNTER — Ambulatory Visit: Admit: 2020-10-03 | Payer: Medicare HMO

## 2020-10-03 SURGERY — COLONOSCOPY WITH PROPOFOL
Anesthesia: General

## 2021-03-06 IMAGING — MG MM DIGITAL SCREENING BILAT W/ TOMO AND CAD
8 series · 8 of 24 positions shown · non-contrast
Comparison: Previous exam(s).

CLINICAL DATA: Screening.

EXAM:
DIGITAL SCREENING BILATERAL MAMMOGRAM WITH TOMOSYNTHESIS AND CAD
TECHNIQUE: Bilateral screening digital craniocaudal and mediolateral oblique
mammograms were obtained. Bilateral screening digital breast
tomosynthesis was performed. The images were evaluated with
computer-aided detection.

[R MLO synth-2D]
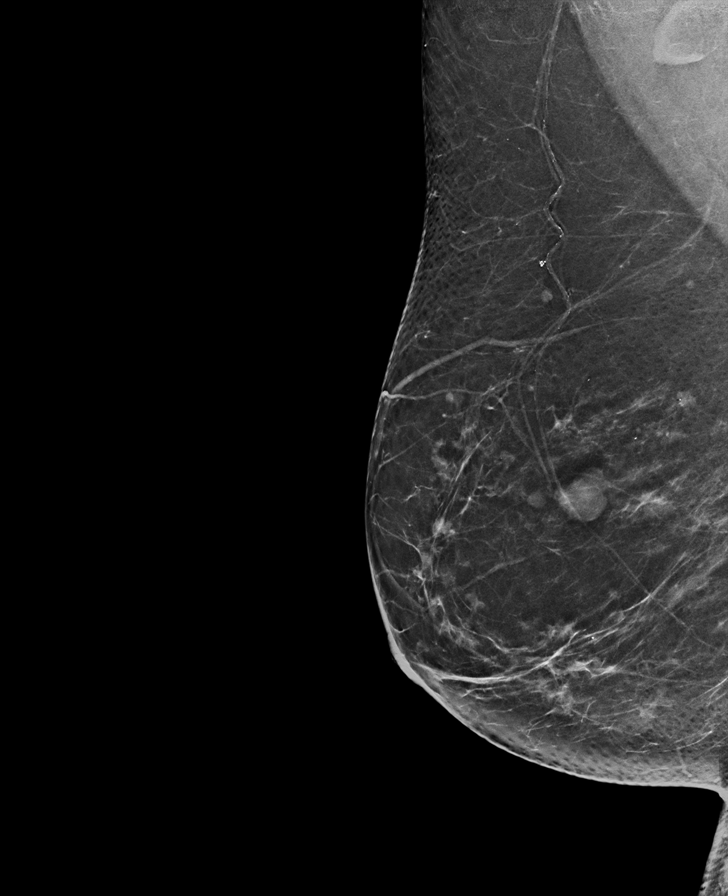

[R CC synth-2D]
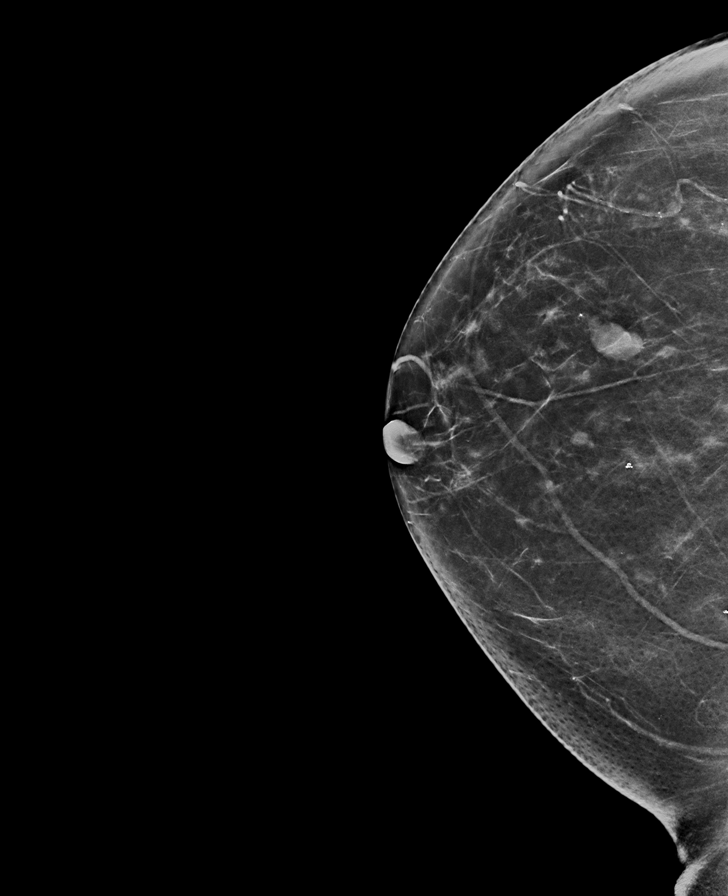

[L CC synth-2D]
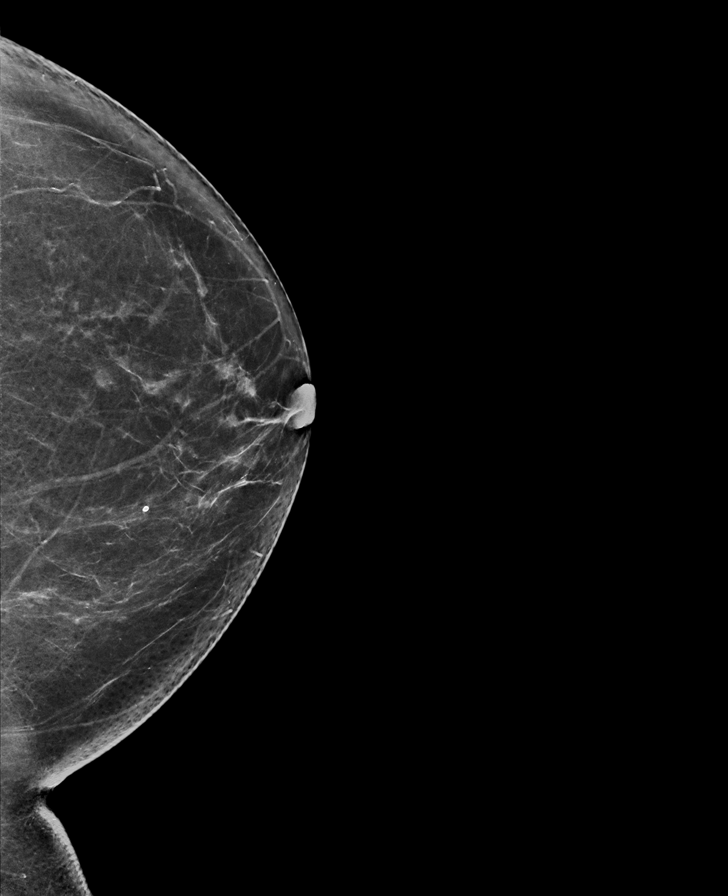

[L MLO synth-2D]
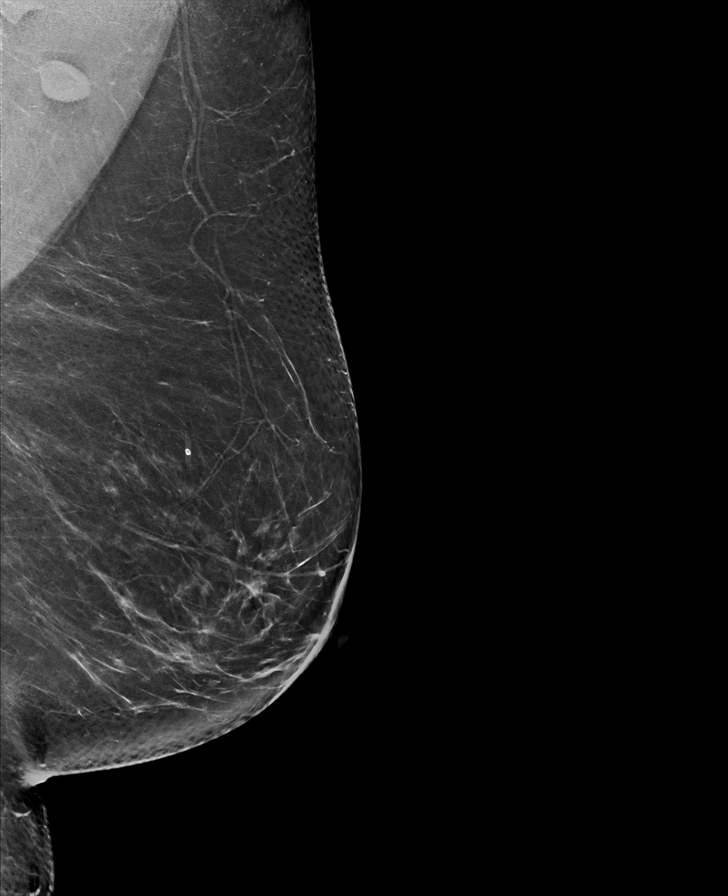

[R MLO tomo · tomo slice 37/72.0]
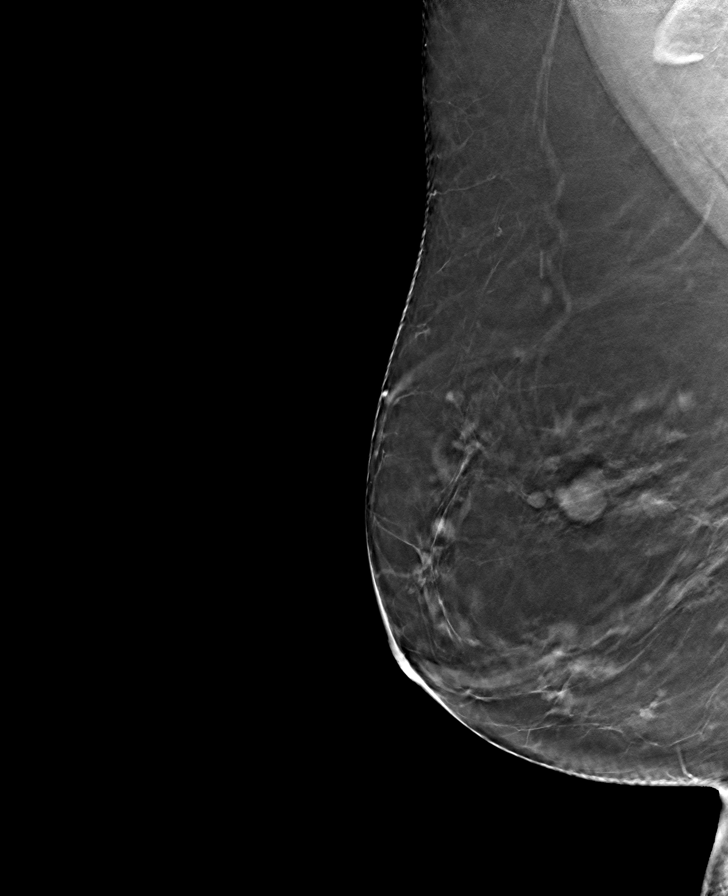

[R CC tomo · tomo slice 37/74.0]
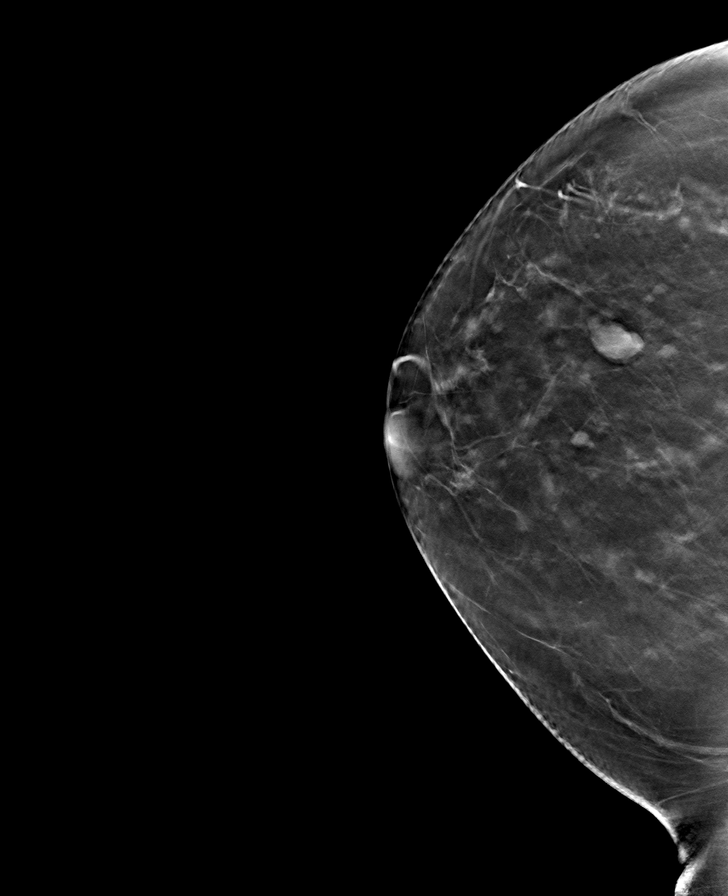

[L CC tomo · tomo slice 39/77.0]
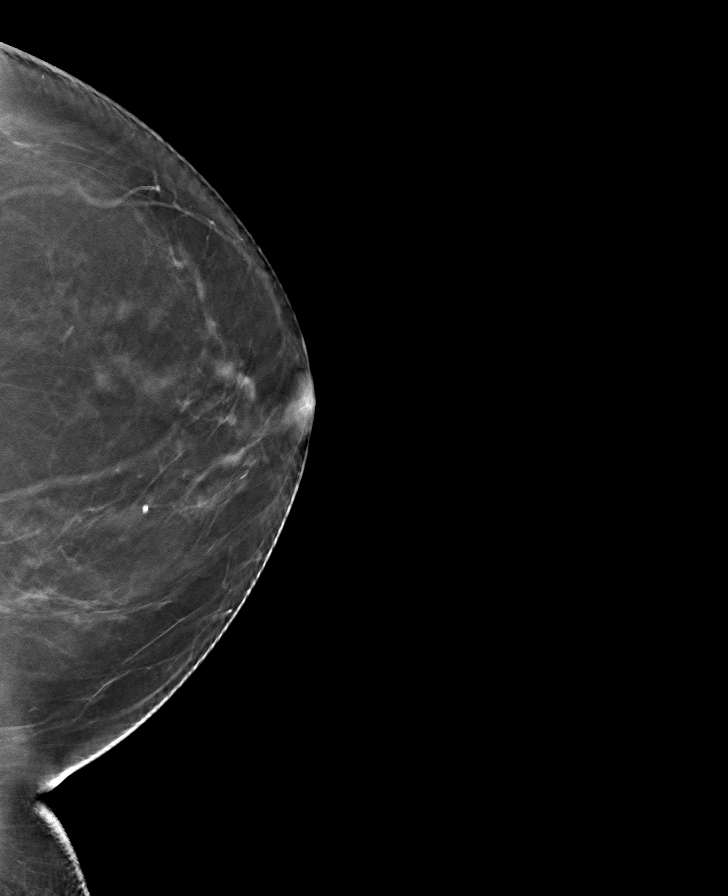

[L MLO tomo · tomo slice 39/78.0]
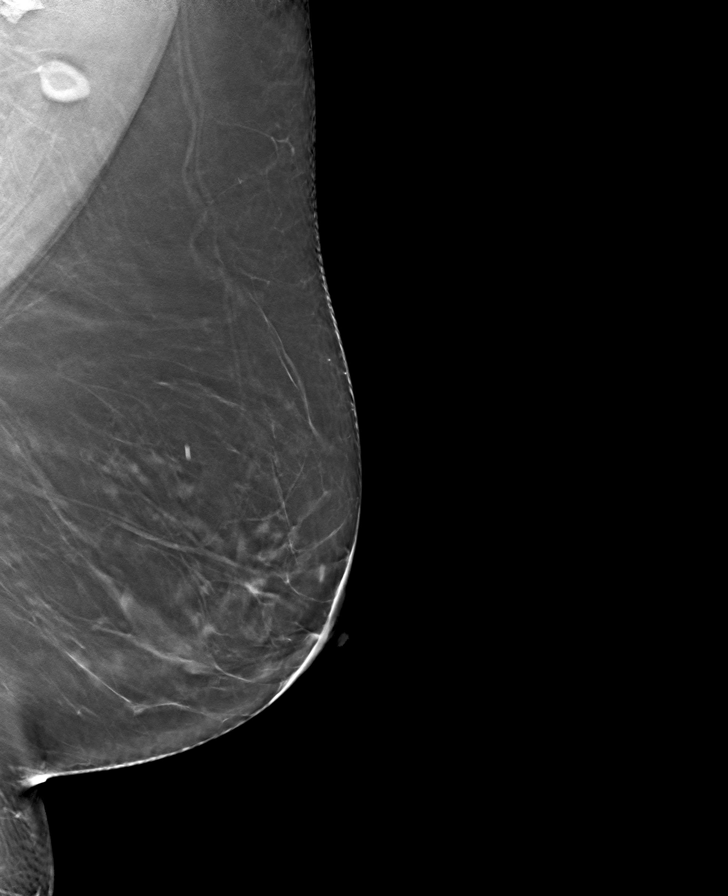

[8 of 24 positions shown; findings below may reference images not displayed]

ACR Breast Density Category b: There are scattered areas of
fibroglandular density.
FINDINGS: There are no findings suspicious for malignancy.
IMPRESSION: No mammographic evidence of malignancy. A result letter of this
screening mammogram will be mailed directly to the patient.

RECOMMENDATION:
Screening mammogram in one year. (Code:51-O-LD2)

BI-RADS CATEGORY  1: Negative.

## 2021-07-21 ENCOUNTER — Other Ambulatory Visit: Payer: Self-pay

## 2021-07-27 ENCOUNTER — Other Ambulatory Visit: Payer: Self-pay

## 2021-07-27 ENCOUNTER — Emergency Department
Admission: EM | Admit: 2021-07-27 | Discharge: 2021-07-31 | Disposition: A | Discharge status: Skilled Nursing Facility | Payer: Medicare HMO | Attending: Emergency Medicine | Admitting: Emergency Medicine

## 2021-07-27 DIAGNOSIS — I4891 Unspecified atrial fibrillation: Secondary | ICD-10-CM | POA: Insufficient documentation

## 2021-07-27 DIAGNOSIS — Z20822 Contact with and (suspected) exposure to covid-19: Secondary | ICD-10-CM | POA: Diagnosis not present

## 2021-07-27 DIAGNOSIS — W1839XD Other fall on same level, subsequent encounter: Secondary | ICD-10-CM | POA: Diagnosis not present

## 2021-07-27 DIAGNOSIS — S42201D Unspecified fracture of upper end of right humerus, subsequent encounter for fracture with routine healing: Secondary | ICD-10-CM | POA: Diagnosis not present

## 2021-07-27 DIAGNOSIS — R296 Repeated falls: Secondary | ICD-10-CM | POA: Diagnosis not present

## 2021-07-27 DIAGNOSIS — I10 Essential (primary) hypertension: Secondary | ICD-10-CM | POA: Insufficient documentation

## 2021-07-27 DIAGNOSIS — S6991XD Unspecified injury of right wrist, hand and finger(s), subsequent encounter: Secondary | ICD-10-CM | POA: Diagnosis present

## 2021-07-27 DIAGNOSIS — E119 Type 2 diabetes mellitus without complications: Secondary | ICD-10-CM | POA: Insufficient documentation

## 2021-07-27 LAB — URINALYSIS, COMPLETE (UACMP) WITH MICROSCOPIC
Bacteria, UA: NONE SEEN
Bilirubin Urine: NEGATIVE
Glucose, UA: NEGATIVE mg/dL
Hgb urine dipstick: NEGATIVE
Ketones, ur: NEGATIVE mg/dL
Leukocytes,Ua: NEGATIVE
Nitrite: NEGATIVE
Protein, ur: NEGATIVE mg/dL
Specific Gravity, Urine: 1.006 (ref 1.005–1.030)
Squamous Epithelial / HPF: NONE SEEN (ref 0–5)
pH: 5 (ref 5.0–8.0)

## 2021-07-27 LAB — COMPREHENSIVE METABOLIC PANEL
ALT: 24 U/L (ref 0–44)
AST: 34 U/L (ref 15–41)
Albumin: 3.1 g/dL — ABNORMAL LOW (ref 3.5–5.0)
Alkaline Phosphatase: 80 U/L (ref 38–126)
Anion gap: 8 (ref 5–15)
BUN: 13 mg/dL (ref 8–23)
CO2: 26 mmol/L (ref 22–32)
Calcium: 9.2 mg/dL (ref 8.9–10.3)
Chloride: 104 mmol/L (ref 98–111)
Creatinine, Ser: 0.7 mg/dL (ref 0.44–1.00)
GFR, Estimated: >60 mL/min (ref >60)
Glucose, Bld: 114 mg/dL — ABNORMAL HIGH (ref 70–99)
Potassium: 3.8 mmol/L (ref 3.5–5.1)
Sodium: 138 mmol/L (ref 135–145)
Total Bilirubin: 0.7 mg/dL (ref 0.3–1.2)
Total Protein: 6.9 g/dL (ref 6.5–8.1)

## 2021-07-27 LAB — CBC
HCT: 35.9 % — ABNORMAL LOW (ref 36.0–46.0)
Hemoglobin: 11.1 g/dL — ABNORMAL LOW (ref 12.0–15.0)
MCH: 26 pg (ref 26.0–34.0)
MCHC: 30.9 g/dL (ref 30.0–36.0)
MCV: 84.1 fL (ref 80.0–100.0)
Platelets: 412 10*3/uL — ABNORMAL HIGH (ref 150–400)
RBC: 4.27 MIL/uL (ref 3.87–5.11)
RDW: 18.8 % — ABNORMAL HIGH (ref 11.5–15.5)
WBC: 14.5 10*3/uL — ABNORMAL HIGH (ref 4.0–10.5)
nRBC: 0 % (ref 0.0–0.2)

## 2021-07-27 LAB — RESP PANEL BY RT-PCR (FLU A&B, COVID) ARPGX2
Influenza A by PCR: NEGATIVE
Influenza B by PCR: NEGATIVE
SARS Coronavirus 2 by RT PCR: NEGATIVE

## 2021-07-27 LAB — CBG MONITORING, ED: Glucose-Capillary: 112 mg/dL — ABNORMAL HIGH (ref 70–99)

## 2021-07-27 MED ORDER — CYCLOBENZAPRINE HCL 10 MG PO TABS
10.0000 mg | ORAL_TABLET | Freq: Three times a day (TID) | ORAL | Status: DC | PRN
  Administered 2021-07-28 – 2021-07-30 (×8): 10 mg via ORAL
  Filled 2021-07-27 (×9): qty 1

## 2021-07-27 MED ORDER — ATORVASTATIN CALCIUM 20 MG PO TABS
80.0000 mg | ORAL_TABLET | Freq: Every day | ORAL | Status: DC
  Administered 2021-07-31: 80 mg via ORAL
  Filled 2021-07-27: qty 4

## 2021-07-27 MED ORDER — AZELASTINE HCL 0.1 % NA SOLN
2.0000 | Freq: Every day | NASAL | Status: DC
  Administered 2021-07-31: 2 via NASAL
  Filled 2021-07-27: qty 30

## 2021-07-27 MED ORDER — DOFETILIDE 250 MCG PO CAPS
250.0000 ug | ORAL_CAPSULE | Freq: Two times a day (BID) | ORAL | Status: DC
  Administered 2021-07-31: 250 ug via ORAL
  Filled 2021-07-27 (×9): qty 1

## 2021-07-27 MED ORDER — ASPIRIN EC 81 MG PO TBEC
81.0000 mg | DELAYED_RELEASE_TABLET | Freq: Every day | ORAL | Status: DC
  Administered 2021-07-31: 81 mg via ORAL
  Filled 2021-07-27: qty 1

## 2021-07-27 MED ORDER — ALBUTEROL SULFATE (2.5 MG/3ML) 0.083% IN NEBU: 2.5000 mg | INHALATION_SOLUTION | Freq: Four times a day (QID) | RESPIRATORY_TRACT | Status: DC | PRN

## 2021-07-27 MED ORDER — FLUTICASONE PROPIONATE 50 MCG/ACT NA SUSP
2.0000 | Freq: Every day | NASAL | Status: DC
  Administered 2021-07-31: 2 via NASAL
  Filled 2021-07-27: qty 16

## 2021-07-27 MED ORDER — METOPROLOL SUCCINATE ER 50 MG PO TB24
50.0000 mg | ORAL_TABLET | Freq: Every day | ORAL | Status: DC
  Administered 2021-07-31: 50 mg via ORAL
  Filled 2021-07-27: qty 1

## 2021-07-27 MED ORDER — PAROXETINE HCL 20 MG PO TABS
40.0000 mg | ORAL_TABLET | Freq: Every day | ORAL | Status: DC
  Administered 2021-07-31: 40 mg via ORAL
  Filled 2021-07-27 (×4): qty 2

## 2021-07-27 MED ORDER — APIXABAN 5 MG PO TABS
5.0000 mg | ORAL_TABLET | Freq: Two times a day (BID) | ORAL | Status: DC
  Administered 2021-07-31: 5 mg via ORAL
  Filled 2021-07-27 (×2): qty 1

## 2021-07-27 MED ORDER — ACETAMINOPHEN 325 MG PO TABS
650.0000 mg | ORAL_TABLET | Freq: Once | ORAL | Status: AC
  Administered 2021-07-27: 650 mg via ORAL
  Filled 2021-07-27: qty 2

## 2021-07-27 MED ORDER — LISINOPRIL 10 MG PO TABS
10.0000 mg | ORAL_TABLET | Freq: Every day | ORAL | Status: DC
  Administered 2021-07-31: 10 mg via ORAL
  Filled 2021-07-27: qty 1

## 2021-07-27 MED ORDER — METFORMIN HCL 500 MG PO TABS
500.0000 mg | ORAL_TABLET | Freq: Every day | ORAL | Status: DC
  Administered 2021-07-31: 500 mg via ORAL
  Filled 2021-07-27 (×2): qty 1

## 2021-07-27 MED ORDER — LATANOPROST 0.005 % OP SOLN: 1.0000 [drp] | Freq: Every day | OPHTHALMIC | Status: DC

## 2021-07-27 MED ORDER — LATANOPROST 0.005 % OP SOLN
1.0000 [drp] | Freq: Every day | OPHTHALMIC | Status: DC
  Filled 2021-07-27: qty 2.5

## 2021-07-27 MED ORDER — MONTELUKAST SODIUM 10 MG PO TABS
10.0000 mg | ORAL_TABLET | Freq: Every day | ORAL | Status: DC
  Administered 2021-07-31: 10 mg via ORAL
  Filled 2021-07-27: qty 1

## 2021-07-27 MED ORDER — PANTOPRAZOLE SODIUM 40 MG PO TBEC
40.0000 mg | DELAYED_RELEASE_TABLET | Freq: Every day | ORAL | Status: DC
  Administered 2021-07-31: 40 mg via ORAL
  Filled 2021-07-27: qty 1

## 2021-07-27 NOTE — TOC Initial Note (Signed)
Transition of Care [facility]) - Initial/Assessment Note    Patient Details  Name: Debbie Wyatt MRN: [id number] Date of Birth: July 06, 1945  Transition of Care [facility] CM/SW Contact:    Shelbie Hutching, RN Phone Number: 07/27/2021, 1:47 PM  Clinical Narrative:                 Patient came into the emergency room with difficulty getting around at home.  Patient fell this past Saturday at home and broke her right proximal humerus near the shoulder, patient's right arm is now in a sling and she is having difficulty getting up and down.  Patient reports that she cannot get up by herself and would like to go to rehab, her home is 2 stories and her husband is very hard of hearing, does not feel safe going back home having mobility difficulties.   Patient agrees to SNF.  PT consult pending.  Bed search started.    Expected Discharge Plan: Skilled Nursing Facility Barriers to Discharge: SNF Pending bed offer, ED SNF auth   Patient Goals and CMS Choice Patient states their goals for this hospitalization and ongoing recovery are:: Patient wants to go to rehab to get stronger so she can get around independently CMS Medicare.gov Compare Post Acute Care list provided to:: Patient Choice offered to / list presented to : Patient  Expected Discharge Plan and Services Expected Discharge Plan: [place]   Discharge Planning Services: CM Consult Post Acute Care Choice: [place] Living arrangements for the past 2 months: Single Family Home                 DME Arranged: N/A DME Agency: NA       HH Arranged: NA HH Agency: NA        Prior Living Arrangements/Services Living arrangements for the past 2 months: Single Family Home Lives with:: Spouse Patient language and need for interpreter reviewed:: Yes Do you feel safe going back to the place where you live?: Yes      Need for Family Participation in Patient Care: Yes (Comment) (broken humerus) Care giver support  system in place?: Yes (comment) (husband and daughters)   Criminal Activity/Legal Involvement Pertinent to Current Situation/Hospitalization: No - Comment as needed  Activities of Daily Living      Permission Sought/Granted Permission sought to share information with : Case Manager, Customer service manager, Family Supports Permission granted to share information with : Yes, Verbal Permission Granted  Share Information with NAME: Meagan Ancona  Permission granted to share info w AGENCY: SNF's  Permission granted to share info w Relationship: daughter  Permission granted to share info w Contact Information: [phone]  Emotional Assessment Appearance:: Appears stated age Attitude/Demeanor/Rapport: Engaged Affect (typically observed): Accepting Orientation: : Oriented to Self, Oriented to Place, Oriented to  Time, Oriented to Situation Alcohol / Substance Use: Not Applicable Psych Involvement: No (comment)  Admission diagnosis:  DM- fall broken shoulder  Patient Active Problem List   Diagnosis Date Noted   Thrush 08/31/2014   URI (upper respiratory infection) 09/09/2012   GERD (gastroesophageal reflux disease) 09/24/2011   Rhinitis, chronic 09/24/2011   Asthma    Hyperlipidemia    WPW (Wolff-Parkinson-White syndrome)    PCP:  Gayland Curry, MD Pharmacy:   [id number] [place], [place] [address] Phone: [phone] Fax: [place] [address] [place]  [facility] [place] [address] Phone: [phone] Fax: [phone]     Social Determinants of Health (SDOH) Interventions    Readmission Risk Interventions No flowsheet data found.

## 2021-07-27 NOTE — ED Triage Notes (Signed)
Pt has right shoulder fx since falling Saturday. Pt lives in 2 story home and was sent here for placement. States not able to take care of herself.

## 2021-07-27 NOTE — ED Notes (Signed)
Daughter at bedside Dairl Ponder. [phone] Other daughter edythe riches [phone]  Daughter advised pt is no longer able to care for herself at home due to multiple falls and family is unable to care for her. Patient lives in two story home with her spouse.

## 2021-07-27 NOTE — ED Notes (Signed)
This tech helped pt to bathroom via wheelchair. Pt requested to stay in wheelchair "for a little while" instead of laying back in bed. No other needs at this time.

## 2021-07-27 NOTE — ED Notes (Signed)
Pt asked for pain medication. EDP made aware. He advised to order 650mg  of tylenol.

## 2021-07-27 NOTE — NC FL2 (Signed)
[place] LEVEL OF CARE SCREENING TOOL     IDENTIFICATION  Patient Name: Debbie Wyatt Birthdate: 1945/06/29 Sex: female Admission Date (Current Location): 07/27/2021  [facility] and [place] Number:  Engineering geologist and Address:  [facility], [address], [place], [place] [address]      Provider Number: [id number]  Attending Physician Name and Address:  Arta Silence, MD  Relative Name and Phone Number:  Haidee Stogsdill- [phone]    Current Level of Care: Hospital Recommended Level of Care: [place] Prior Approval Number:    Date Approved/Denied:   PASRR Number: [id number]  Discharge Plan: SNF    Current Diagnoses: Patient Active Problem List   Diagnosis Date Noted   Thrush 08/31/2014   URI (upper respiratory infection) 09/09/2012   GERD (gastroesophageal reflux disease) 09/24/2011   Rhinitis, chronic 09/24/2011   Asthma    Hyperlipidemia    WPW (Wolff-Parkinson-White syndrome)     Orientation RESPIRATION BLADDER Height & Weight     Self, Time, Situation, Place  Normal Continent Weight: 96.2 kg Height:  5\' 2"  (157.5 cm)  BEHAVIORAL SYMPTOMS/MOOD NEUROLOGICAL BOWEL NUTRITION STATUS      Continent Diet (Regular)  AMBULATORY STATUS COMMUNICATION OF NEEDS Skin   Supervision Verbally Normal                       Personal Care Assistance Level of Assistance  Bathing, Dressing, Feeding Bathing Assistance: Limited assistance Feeding assistance: Limited assistance Dressing Assistance: Limited assistance     Functional Limitations Info  Sight, Hearing, Speech Sight Info: Adequate Hearing Info: Adequate Speech Info: Adequate    SPECIAL CARE FACTORS FREQUENCY  PT (By licensed PT), OT (By licensed OT)     PT Frequency: 5 times per week OT Frequency: 5 times per week            Contractures Contractures Info: Not present    Additional Factors Info  Code Status,  Allergies Code Status Info: Full Allergies Info: Augmentin, Percocet, Bextra           Current Medications (07/27/2021):  This is the current hospital active medication list Current Facility-Administered Medications  Medication Dose Route Frequency Provider Last Rate Last Admin   albuterol (PROVENTIL) (2.5 MG/3ML) 0.083% nebulizer solution 2.5 mg  2.5 mg Inhalation Q6H PRN Arta Silence, MD       apixaban Arne [place]) tablet 5 mg  5 mg Oral BID Arta Silence, MD       aspirin EC tablet 81 mg  81 mg Oral Daily Arta Silence, MD       atorvastatin (LIPITOR) tablet 80 mg  80 mg Oral Daily Arta Silence, MD       Derrill Memo ON 07/28/2021] azelastine (ASTELIN) 0.1 % nasal spray 2 spray  2 spray Each Nare Daily Arta Silence, MD       cyclobenzaprine (FLEXERIL) tablet 10 mg  10 mg Oral TID PRN Arta Silence, MD       dofetilide (TIKOSYN) capsule 250 mcg  250 mcg Oral Q12H Arta Silence, MD       [START ON 07/28/2021] fluticasone (FLONASE) 50 MCG/ACT nasal spray 2 spray  2 spray Each Nare Daily Arta Silence, MD       latanoprost (XALATAN) 0.005 % ophthalmic solution 1 drop  1 drop Both Eyes QHS Arta Silence, MD       lisinopril (ZESTRIL) tablet 10 mg  10 mg Oral Daily Arta Silence, MD       [  START ON 07/28/2021] metFORMIN (GLUCOPHAGE) tablet 500 mg  500 mg Oral Q breakfast Arta Silence, MD       metoprolol succinate (TOPROL-XL) 24 hr tablet 50 mg  50 mg Oral Daily Arta Silence, MD       montelukast (SINGULAIR) tablet 10 mg  10 mg Oral Daily Arta Silence, MD       pantoprazole (PROTONIX) EC tablet 40 mg  40 mg Oral Daily Arta Silence, MD       Derrill Memo ON 07/28/2021] PARoxetine (PAXIL) tablet 40 mg  40 mg Oral Daily Arta Silence, MD       Current Outpatient Medications  Medication Sig Dispense Refill   apixaban (ELIQUIS) 5 MG TABS tablet Take by mouth daily.      aspirin EC 81 MG tablet Take by mouth daily.       atorvastatin (LIPITOR) 40 MG tablet Take 80 mg by mouth daily.      clobetasol (TEMOVATE) 0.05 % external solution Apply 1 application topically daily.     cyclobenzaprine (FLEXERIL) 10 MG tablet Take 10 mg by mouth 3 (three) times daily.     dofetilide (TIKOSYN) 250 MCG capsule Take 250 mcg by mouth 2 (two) times daily.     DULoxetine (CYMBALTA) 30 MG capsule Take 60 mg by mouth daily.      FLOVENT DISKUS 250 MCG/ACT AEPB Inhale 1 puff into the lungs in the morning and at bedtime.     fluticasone (FLONASE) 50 MCG/ACT nasal spray Place 2 sprays into both nostrils daily.     latanoprost (XALATAN) 0.005 % ophthalmic solution Place 1 drop into both eyes daily.     levocetirizine (XYZAL) 5 MG tablet Take 5 mg by mouth every evening.     lisinopril (PRINIVIL,ZESTRIL) 10 MG tablet Take 10 mg by mouth daily.     metoprolol succinate (TOPROL-XL) 25 MG 24 hr tablet Take 2 tablets by mouth daily.     metoprolol tartrate (LOPRESSOR) 25 MG tablet Take 1 tablet by mouth daily.     montelukast (SINGULAIR) 10 MG tablet Take 10 mg by mouth at bedtime.     Multiple Vitamin (MULTIVITAMIN WITH MINERALS) TABS tablet Take 1 tablet by mouth daily.     omeprazole (PRILOSEC) 40 MG capsule Take 40 mg by mouth daily.     PARoxetine (PAXIL) 40 MG tablet Take 40 mg by mouth daily.  3   Potassium 99 MG TABS Take 1 tablet by mouth daily.      Sodium Chloride-Sodium Bicarb (CLASSIC NETI POT SINUS WASH NA) Use as directed as needed     acetaminophen-codeine (TYLENOL #3) 300-30 MG tablet Take 1 tablet by mouth as needed.  (Patient not taking: Reported on 07/27/2021)     albuterol (PROAIR HFA) 108 (90 BASE) MCG/ACT inhaler Inhale 2 puffs into the lungs every 6 (six) hours as needed for wheezing (use 10 minutes before exercise). 1 Inhaler 5   Azelastine HCl 0.15 % SOLN Place 1 puff into the nose daily.     brimonidine (ALPHAGAN) 0.15 % ophthalmic solution Place 1 drop into both eyes 3 (three) times daily.  (Patient not taking:  Reported on 07/27/2021)     Butalbital-APAP-Caff-Cod 50-300-40-30 MG CAPS Take by mouth daily as needed.     dorzolamide-timolol (COSOPT) 22.3-6.8 MG/ML ophthalmic solution Place 1 drop into both eyes 3 (three) times daily.  (Patient not taking: Reported on 07/27/2021)     Fluticasone-Salmeterol (ADVAIR) 250-50 MCG/DOSE AEPB INHALE 1 PUFF BY MOUTH TWICE A DAY (Patient  not taking: Reported on 07/27/2021)     HYDROcodone-acetaminophen (NORCO) 10-325 MG tablet Take 1 tablet by mouth every 6 (six) hours as needed.     MAGNESIUM PO Take 1 tablet by mouth daily. (Patient not taking: Reported on 07/27/2021)     metFORMIN (GLUCOPHAGE) 500 MG tablet Take 1 tablet by mouth daily.     metoprolol succinate (TOPROL-XL) 25 MG 24 hr tablet Take 25 mg by mouth daily. Take 1.5 tablets daily (Patient not taking: Reported on 07/27/2021)     mupirocin ointment (BACTROBAN) 2 % Apply 1 application topically 3 (three) times daily. (Patient not taking: Reported on 03/10/2018) 22 g 0   nystatin (MYCOSTATIN) 100000 UNIT/ML suspension 29mL swish and spit tid (Patient not taking: Reported on 03/10/2018) 473 mL 6   omeprazole (PRILOSEC) 20 MG capsule Take 20 mg by mouth daily. (Patient not taking: Reported on 07/27/2021)     rivaroxaban (XARELTO) 20 MG TABS tablet Take 1 tablet (20 mg total) by mouth daily with supper. (Patient not taking: Reported on 03/10/2018) 30 tablet 5     Discharge Medications: Please see discharge summary for a list of discharge medications.  Relevant Imaging Results:  Relevant Lab Results:   Additional Information SS# [id number]  Shelbie Hutching, RN

## 2021-07-27 NOTE — ED Notes (Signed)
OT WALKED PT TO THE RESTROOM. STAND BY ASSISTANCE.

## 2021-07-27 NOTE — Evaluation (Signed)
Physical Therapy Evaluation Patient Details Name: Debbie Wyatt MRN: [id number] DOB: [birth year] Today's Date: 07/27/2021  History of Present Illness  Pt is a 76 y.o. female presenting to ED with persistent pain and difficulty with ADL's after a R proximal humerus fx (occurred 6 days prior); interested in rehab placement.  PMH includes a-fib, htn, DM, and glaucoma.  Clinical Impression  Prior to ED visit and recent falls, pt was independent with functional mobility; since fall last Saturday, pt has had multiple falls (including 3 in last 24 hours) and reporting difficulty managing at home.  Pt lives with her husband in 2 story home (bedroom and full bath 2nd floor) with 1 STE (pt's husband has not been able to assist pt with the help she needs recently).  Pt requesting to toilet upon PT arrival so therapist assisted pt with walking to bathroom during session.  Currently pt is min to mod assist with bed mobility; min to mod assist with transfers (hand hold assist); and min to mod assist (for balance) walking 40 feet x2 with single hand hold assist.  Pt requiring min to mod assist for balance during sessions activities and appears to be at high risk for falling.  Pt would benefit from skilled PT to address noted impairments and functional limitations (see below for any additional details).  Upon hospital discharge, pt would benefit from SNF.    Recommendations for follow up therapy are one component of a multi-disciplinary discharge planning process, led by the attending physician.  Recommendations may be updated based on patient status, additional functional criteria and insurance authorization.  Follow Up Recommendations Skilled nursing-short term rehab (<3 hours/day)    Assistance Recommended at Discharge Frequent or constant Supervision/Assistance  Patient can return home with the following  A lot of help with walking and/or transfers;A lot of help with bathing/dressing/bathroom;Assistance  with cooking/housework;Assist for transportation;Help with stairs or ramp for entrance    Equipment Recommendations BSC/3in1;Wheelchair cushion (measurements PT);Wheelchair (measurements PT)  Recommendations for Other Services  OT consult    Functional Status Assessment Patient has had a recent decline in their functional status and demonstrates the ability to make significant improvements in function in a reasonable and predictable amount of time.     Precautions / Restrictions Precautions Precautions: Fall Required Braces or Orthoses: Sling Restrictions Weight Bearing Restrictions: Yes RUE Weight Bearing: Non weight bearing      Mobility  Bed Mobility Overal bed mobility: Needs Assistance Bed Mobility: Supine to Sit, Sit to Supine     Supine to sit: Mod assist, HOB elevated (assist for trunk) Sit to supine: Min assist, Mod assist (assist for trunk and B LE's)   General bed mobility comments: vc's for technique    Transfers Overall transfer level: Needs assistance Equipment used: 1 person hand held assist Transfers: Sit to/from Stand Sit to Stand: Min assist, Mod assist           General transfer comment: x1 trial from low bed and x1 trial from toilet (use of grab bar); vc's for technique    Ambulation/Gait Ambulation/Gait assistance: Min assist, Mod assist Gait Distance (Feet):  (40 feet x2) Assistive device: 1 person hand held assist   Gait velocity: decreased     General Gait Details: vc's for longer step length; assist for balance/to steady; increased B lateral sway noted  Stairs            Wheelchair Mobility    Modified Rankin (Stroke Patients Only)  Balance Overall balance assessment: Needs assistance Sitting-balance support: No upper extremity supported, Feet supported Sitting balance-Leahy Scale: Good Sitting balance - Comments: steady sitting reaching within BOS with L UE   Standing balance support: Single extremity supported,  During functional activity Standing balance-Leahy Scale: Poor Standing balance comment: Pt requiring assist for balance with ambulation (min to mod assist); min assist for balance washing L hand at sink                             Pertinent Vitals/Pain Pain Assessment Pain Assessment: Faces Faces Pain Scale: Hurts little more Pain Location: R shoulder Pain Descriptors / Indicators: Sore Pain Intervention(s): Limited activity within patient's tolerance, Monitored during session, Repositioned    Home Living Family/patient expects to be discharged to:: Private residence Living Arrangements: Spouse/significant other   Type of Home: House Home Access: Stairs to enter Entrance Stairs-Rails: None Entrance Stairs-Number of Steps: 1 Alternate Level Stairs-Number of Steps: 14 Home Layout: Two level;1/2 bath on main level;Bed/bath upstairs Home Equipment: Grab bars - tub/shower      Prior Function Prior Level of Function : Independent/Modified Independent             Mobility Comments: Multiple falls since fall on Saturday       Hand Dominance        Extremity/Trunk Assessment   Upper Extremity Assessment Upper Extremity Assessment: RUE deficits/detail RUE Deficits / Details: R UE in shoulder immobilizer RUE: Unable to fully assess due to immobilization    Lower Extremity Assessment Lower Extremity Assessment: Generalized weakness    Cervical / Trunk Assessment Cervical / Trunk Assessment: Other exceptions Cervical / Trunk Exceptions: forward head/shoulders  Communication   Communication: No difficulties  Cognition Arousal/Alertness: Awake/alert Behavior During Therapy: WFL for tasks assessed/performed Overall Cognitive Status: Within Functional Limits for tasks assessed                                 General Comments: Tangential at times        General Comments General comments (skin integrity, edema, etc.): R UE in sling.  Pt  agreeable to PT session.  Pt's daughter present during session.    Exercises  Transfer and gait training   Assessment/Plan    PT Assessment Patient needs continued PT services  PT Problem List Decreased strength;Decreased activity tolerance;Decreased balance;Decreased mobility;Decreased knowledge of use of DME;Decreased safety awareness;Decreased knowledge of precautions;Pain       PT Treatment Interventions DME instruction;Gait training;Stair training;Functional mobility training;Therapeutic activities;Therapeutic exercise;Balance training;Patient/family education    PT Goals (Current goals can be found in the Care Plan section)  Acute Rehab PT Goals Patient Stated Goal: to improve balance and strength PT Goal Formulation: With patient Time For Goal Achievement: 08/10/21 Potential to Achieve Goals: Good    Frequency 7X/week     Co-evaluation               AM-PAC PT "6 Clicks" Mobility  Outcome Measure Help needed turning from your back to your side while in a flat bed without using bedrails?: A Little Help needed moving from lying on your back to sitting on the side of a flat bed without using bedrails?: A Lot Help needed moving to and from a bed to a chair (including a wheelchair)?: A Lot Help needed standing up from a chair using your arms (e.g., wheelchair or bedside chair)?: A  Lot Help needed to walk in hospital room?: A Lot Help needed climbing 3-5 steps with a railing? : Total 6 Click Score: 12    End of Session Equipment Utilized During Treatment: Gait belt;Other (comment) (R UE shoulder sling) Activity Tolerance: Patient tolerated treatment well Patient left: in bed;with call bell/phone within reach;with family/visitor present   PT Visit Diagnosis: Unsteadiness on feet (R26.81);Other abnormalities of gait and mobility (R26.89);Muscle weakness (generalized) (M62.81);History of falling (Z91.81);Repeated falls (R29.6);Pain Pain - Right/Left: Right Pain - part  of body: Shoulder    Time: 1356-1430 PT Time Calculation (min) (ACUTE ONLY): 34 min   Charges:   PT Evaluation $PT Eval Low Complexity: 1 Low PT Treatments $Gait Training: 8-22 mins $Therapeutic Activity: 8-22 mins       Leitha Bleak, PT 07/27/21, 2:50 PM

## 2021-07-27 NOTE — ED Notes (Signed)
PILLOW PROVIDED TO BE PLACED UNDER SHOULDER.

## 2021-07-27 NOTE — ED Provider Notes (Signed)
[facility] Provider Note    Event Date/Time   First MD Initiated Contact with Patient 07/27/21 1124     (approximate)   History   Rehab placement    HPI  Debbie Wyatt is a 76 y.o. female who presents for rehab placement after a recent proximal humerus fracture.  The patient states that she had a mechanical fall 6 days ago resulting in a right humerus fracture that was evaluated at an outside ED.  The patient subsequently followed up with her primary care provider 4 days ago.  She was on hydrocodone initially but was unable to tolerate it due to severe itching.  She has had some difficulty with pain control.  The patient lives alone on the second floor and has not been able to adequately take care of herself.  She has had several further falls due to her mobility issues.  She denies any new medical complaints.   Physical Exam   Triage Vital Signs: ED Triage Vitals  Enc Vitals Group     BP 07/27/21 1018 138/67     Pulse Rate 07/27/21 1018 83     Resp 07/27/21 1018 18     Temp 07/27/21 1018 98.5 F (36.9 C)     Temp Source 07/27/21 1018 Oral     SpO2 07/27/21 1018 92 %     Weight 07/27/21 1024 212 lb (96.2 kg)     Height 07/27/21 1024 5\' 2"  (1.575 m)     Head Circumference --      Peak Flow --      Pain Score 07/27/21 1024 10     Pain Loc --      Pain Edu? --      Excl. in [place]? --     Most recent vital signs: Vitals:   07/27/21 1018  BP: 138/67  Pulse: 83  Resp: 18  Temp: 98.5 F (36.9 C)  SpO2: 92%    General: Awake, no distress.  CV:  Good peripheral perfusion.  Resp:  Normal effort.  Abd:  No distention.  Other:  No peripheral edema.   ED Results / Procedures / Treatments   Labs (all labs ordered are listed, but only abnormal results are displayed) Labs Reviewed  CBC - Abnormal; Notable for the following components:      Result Value   WBC 14.5 (*)    Hemoglobin 11.1 (*)    HCT 35.9 (*)    RDW 18.8 (*)    Platelets  412 (*)    All other components within normal limits  COMPREHENSIVE METABOLIC PANEL - Abnormal; Notable for the following components:   Glucose, Bld 114 (*)    Albumin 3.1 (*)    All other components within normal limits  URINALYSIS, COMPLETE (UACMP) WITH MICROSCOPIC - Abnormal; Notable for the following components:   Color, Urine COLORLESS (*)    APPearance CLEAR (*)    All other components within normal limits  RESP PANEL BY RT-PCR (FLU A&B, COVID) [id number]     EKG     RADIOLOGY    PROCEDURES:  Critical Care performed: No  Procedures   MEDICATIONS ORDERED IN ED: Medications  albuterol (PROVENTIL) (2.5 MG/3ML) 0.083% nebulizer solution 2.5 mg (has no administration in time range)  aspirin EC tablet 81 mg (has no administration in time range)  atorvastatin (LIPITOR) tablet 80 mg (has no administration in time range)  azelastine (ASTELIN) 0.1 % nasal spray 2 spray (has no administration in time range)  dofetilide (TIKOSYN) capsule 250 mcg (has no administration in time range)  apixaban (ELIQUIS) tablet 5 mg (has no administration in time range)  fluticasone (FLONASE) 50 MCG/ACT nasal spray 2 spray (has no administration in time range)  lisinopril (ZESTRIL) tablet 10 mg (has no administration in time range)  metFORMIN (GLUCOPHAGE) tablet 500 mg (has no administration in time range)  metoprolol succinate (TOPROL-XL) 24 hr tablet 50 mg (has no administration in time range)  montelukast (SINGULAIR) tablet 10 mg (has no administration in time range)  pantoprazole (PROTONIX) EC tablet 40 mg (has no administration in time range)  PARoxetine (PAXIL) tablet 40 mg (has no administration in time range)  cyclobenzaprine (FLEXERIL) tablet 10 mg (has no administration in time range)  latanoprost (XALATAN) 0.005 % ophthalmic solution 1 drop (has no administration in time range)     IMPRESSION / MDM / ASSESSMENT AND PLAN / ED COURSE  I reviewed the triage vital signs and the nursing  notes.  76 year old female with PMH of atrial fibrillation, hypertension, diabetes, and glaucoma presents with persistent pain and difficulty with ADLs after a right proximal humerus fracture that occurred 6 days ago.  She is interested in rehab placement.  She has no acute medical issues today.  I reviewed the past medical records and confirmed the imaging report from 2/11 showing a fracture and the patient's visit with primary care on 2/13.  On exam her vital signs are normal.  Physical exam is otherwise unremarkable.  The patient is well-appearing.  I have ordered PT consultation as well as TOC consultation for placement.  I also ordered the patient's home medications after confirming the list with her myself.  Plan will be disposition Per TOC recommendations.    FINAL CLINICAL IMPRESSION(S) / ED DIAGNOSES   Final diagnoses:  Closed fracture of proximal end of right humerus with routine healing, unspecified fracture morphology, subsequent encounter     Rx / DC Orders   ED Discharge Orders     None        Note:  This document was prepared using Dragon voice recognition software and may include unintentional dictation errors.    Arta Silence, MD 07/27/21 1537

## 2021-07-28 MED ORDER — LOPERAMIDE HCL 2 MG PO CAPS
2.0000 mg | ORAL_CAPSULE | Freq: Three times a day (TID) | ORAL | Status: DC | PRN
  Administered 2021-07-28: 2 mg via ORAL
  Filled 2021-07-28: qty 1

## 2021-07-28 NOTE — ED Notes (Signed)
Pt resting comfortably in bed, NAD. No needs verbalized at this time. Husband at [facility]. Bed low & locked; call light & personal items within reach.

## 2021-07-28 NOTE — ED Notes (Signed)
Patient assisted to bathroom by this tech. Patient complaining about diarrhea. Nurse made aware. This tech assisted patient with a partial bed bath. New gown changed. Patient in bed resting.

## 2021-07-28 NOTE — ED Notes (Signed)
Assisted pt to toilet so pt could have BM- pt states she is having diarrhea- pt ambulated to the toilet and back with 1 assist and shuffle gait

## 2021-07-28 NOTE — ED Notes (Signed)
Pt given breakfast tray and orange juice. 

## 2021-07-28 NOTE — ED Notes (Signed)
Pt resting comfortably in bed, NAD. No needs verbalized at this time. Bed low & locked; call light & personal items within reach.

## 2021-07-28 NOTE — Evaluation (Signed)
Occupational Therapy Evaluation Patient Details Name: Debbie Wyatt MRN: [id number] DOB: [birth year] Today's Date: 07/28/2021   History of Present Illness Pt is a 76 y.o. female presenting to ED with persistent pain and difficulty with ADL's after a R proximal humerus fx (occurred 6 days prior); interested in rehab placement.  PMH includes a-fib, htn, DM, and glaucoma.   Clinical Impression   Pt seen for OT evaluation tx this date. She reports being INDEP at baseline, but recent weakness and falling. She lives in 2 story home with BR on 2nd floor. OT repositions sling to outside of clothing to improve comfort and reduce risk for skin breakdown. This date, pt requires: SETUP for feeding/grooming, MOD A for seated UB bathing/dressing, MAX A for sling mgt and LB ADLs, MOD A for HHA transfers. Pt up to recliner with all needs met and in reach end of session. Will continue to follow. Recommend STR f/u OT services while healing R UE and to learn modifications/adaptations.    Recommendations for follow up therapy are one component of a multi-disciplinary discharge planning process, led by the attending physician.  Recommendations may be updated based on patient status, additional functional criteria and insurance authorization.   Follow Up Recommendations  Skilled nursing-short term rehab (<3 hours/day)    Assistance Recommended at Discharge Frequent or constant Supervision/Assistance  Patient can return home with the following A lot of help with walking and/or transfers;A lot of help with bathing/dressing/bathroom    Functional Status Assessment  Patient has had a recent decline in their functional status and demonstrates the ability to make significant improvements in function in a reasonable and predictable amount of time.  Equipment Recommendations  BSC/3in1;Tub/shower seat;Other (comment) (hemi walker)    Recommendations for Other Services       Precautions / Restrictions  Precautions Precautions: Fall Required Braces or Orthoses: Sling Restrictions Weight Bearing Restrictions: Yes RUE Weight Bearing: Non weight bearing      Mobility Bed Mobility Overal bed mobility: Needs Assistance Bed Mobility: Supine to Sit, Sit to Supine     Supine to sit: HOB elevated, Supervision Sit to supine: HOB elevated, Supervision   General bed mobility comments: vc's for technique; increased effort and time for pt to perform on own (getting in/OOB on L side)    Transfers Overall transfer level: Needs assistance Equipment used: 1 person hand held assist Transfers: Sit to/from Stand Sit to Stand: Mod assist           General transfer comment: x3 trials from bed and x1 trial from toilet (use of grab bar); vc's for technique      Balance Overall balance assessment: Needs assistance Sitting-balance support: No upper extremity supported, Feet supported Sitting balance-Leahy Scale: Good Sitting balance - Comments: steady sitting reaching within BOS with L UE   Standing balance support: Single extremity supported, During functional activity Standing balance-Leahy Scale: Poor Standing balance comment: pt requiring assist for static standing balance with L UE support                           ADL either performed or assessed with clinical judgement   ADL Overall ADL's : Needs assistance/impaired                                       General ADL Comments: SETUP for feeding/grooming, MOD A for seated UB  bathing/dressing, MAX A for sling mgt and LB ADLs, MOD A for HHA transfers.     Vision Baseline Vision/History: 1 Wears glasses Patient Visual Report: No change from baseline       Perception     Praxis      Pertinent Vitals/Pain Pain Assessment Pain Score: 8  Pain Location: R shoulder Pain Descriptors / Indicators: Sore Pain Intervention(s): Limited activity within patient's tolerance, Monitored during session, Repositioned      Hand Dominance Right   Extremity/Trunk Assessment Upper Extremity Assessment Upper Extremity Assessment: RUE deficits/detail RUE Deficits / Details: R UE in shoulder immobilizer   Lower Extremity Assessment Lower Extremity Assessment: Generalized weakness   Cervical / Trunk Assessment Cervical / Trunk Assessment: Other exceptions Cervical / Trunk Exceptions: forward head/shoulders   Communication Communication Communication: No difficulties   Cognition Arousal/Alertness: Awake/alert Behavior During Therapy: Anxious Overall Cognitive Status: Within Functional Limits for tasks assessed                                 General Comments: Talkative; tangential; appearing anxious     General Comments       Exercises Other Exercises Other Exercises: OT ed re: role   Shoulder Instructions      Home Living Family/patient expects to be discharged to:: Private residence Living Arrangements: Spouse/significant other Available Help at Discharge: Family Type of Home: House Home Access: Stairs to enter Technical brewer of Steps: 1 Entrance Stairs-Rails: None Home Layout: Two level;1/2 bath on main level;Bed/bath upstairs Alternate Level Stairs-Number of Steps: 14 Alternate Level Stairs-Rails: Left Bathroom Shower/Tub: Tub/shower unit;Walk-in shower   Bathroom Toilet: Standard     Home Equipment: Grab bars - tub/shower          Prior Functioning/Environment Prior Level of Function : Independent/Modified Independent             Mobility Comments: Multiple falls since fall on Saturday          OT Problem List: Decreased strength;Decreased activity tolerance;Impaired balance (sitting and/or standing);Decreased coordination;Decreased knowledge of use of DME or AE;Cardiopulmonary status limiting activity;Obesity      OT Treatment/Interventions: Self-care/ADL training;Therapeutic exercise;Balance training;Patient/family education;Therapeutic  activities    OT Goals(Current goals can be found in the care plan section) Acute Rehab OT Goals Patient Stated Goal: to figure out why I'm falling OT Goal Formulation: With patient Time For Goal Achievement: 08/11/21 Potential to Achieve Goals: Good ADL Goals Pt Will Perform Upper Body Dressing: with supervision (with modified technique) Pt Will Perform Lower Body Dressing: with min guard assist Pt Will Transfer to Toilet: with min guard assist Pt Will Perform Toileting - Clothing Manipulation and hygiene: with min guard assist  OT Frequency: Min 2X/week    Co-evaluation              AM-PAC OT "6 Clicks" Daily Activity     Outcome Measure Help from another person eating meals?: None Help from another person taking care of personal grooming?: A Little Help from another person toileting, which includes using toliet, bedpan, or urinal?: A Lot Help from another person bathing (including washing, rinsing, drying)?: A Lot Help from another person to put on and taking off regular upper body clothing?: A Little Help from another person to put on and taking off regular lower body clothing?: A Lot 6 Click Score: 16   End of Session Equipment Utilized During Treatment: Gait belt;Other (comment) (hand held assist) Nurse  Communication: Mobility status  Activity Tolerance: Patient tolerated treatment well Patient left: in bed;with call bell/phone within reach;with bed alarm set  OT Visit Diagnosis: Unsteadiness on feet (R26.81);Repeated falls (R29.6);Muscle weakness (generalized) (M62.81)                Time: 7276-1848 OT Time Calculation (min): 33 min Charges:  OT General Charges $OT Visit: 1 Visit OT Evaluation $OT Eval Moderate Complexity: 1 Mod OT Treatments $Self Care/Home Management : 8-22 mins  Gerrianne Scale, MS, OTR/L ascom [phone] 07/28/21, 4:25 PM

## 2021-07-28 NOTE — ED Notes (Addendum)
Secure msg sent to Dr. Kerman Passey re: pts request for something to relieve her diarrhea.

## 2021-07-28 NOTE — ED Notes (Signed)
Attempted to contact pharmacy to update pt medication schedule. Unsuccessful at this time.

## 2021-07-28 NOTE — Progress Notes (Signed)
Physical Therapy Treatment Patient Details Name: Debbie Wyatt MRN: [id number] DOB: 06-09-1946 Today's Date: 07/28/2021   History of Present Illness Pt is a 76 y.o. female presenting to ED with persistent pain and difficulty with ADL's after a R proximal humerus fx (occurred 6 days prior); interested in rehab placement.  PMH includes a-fib, htn, DM, and glaucoma.    PT Comments    Pt resting in bed upon PT arrival; pt appearing anxious today and shaky with activity (although both improved intermittently with breathing cues); pt also noted to be talkative and tangential with conversation requiring redirection.  SBA semi-supine to sitting edge of bed (increased effort and time for pt to perform on own); min to mod assist with transfers; and min to mod assist with ambulation 100 feet with single hand hold assist.  Pt assisted with toileting during session.  D/t pt still requiring assist for balance with functional mobility, continue to recommend discharge to SNF to improve balance and independence with functional mobility.   Recommendations for follow up therapy are one component of a multi-disciplinary discharge planning process, led by the attending physician.  Recommendations may be updated based on patient status, additional functional criteria and insurance authorization.  Follow Up Recommendations  Skilled nursing-short term rehab (<3 hours/day)     Assistance Recommended at Discharge Frequent or constant Supervision/Assistance  Patient can return home with the following A lot of help with walking and/or transfers;A lot of help with bathing/dressing/bathroom;Assistance with cooking/housework;Assist for transportation;Help with stairs or ramp for entrance   Equipment Recommendations  BSC/3in1;Wheelchair cushion (measurements PT);Wheelchair (measurements PT)    Recommendations for Other Services OT consult     Precautions / Restrictions Precautions Precautions: Fall Required  Braces or Orthoses: Sling Restrictions Weight Bearing Restrictions: Yes RUE Weight Bearing: Non weight bearing     Mobility  Bed Mobility Overal bed mobility: Needs Assistance Bed Mobility: Supine to Sit, Sit to Supine     Supine to sit: HOB elevated, Supervision Sit to supine: HOB elevated, Supervision   General bed mobility comments: vc's for technique; increased effort and time for pt to perform on own (getting in/OOB on L side)    Transfers Overall transfer level: Needs assistance Equipment used: 1 person hand held assist Transfers: Sit to/from Stand Sit to Stand: Min assist, Mod assist           General transfer comment: x3 trials from bed and x1 trial from toilet (use of grab bar); vc's for technique    Ambulation/Gait Ambulation/Gait assistance: Min assist, Mod assist Gait Distance (Feet): 100 Feet Assistive device: 1 person hand held assist   Gait velocity: decreased     General Gait Details: pt appearing shaky with short shuffling steps requiring vc's to increase step length; assist to steady/for balance required   Stairs             Wheelchair Mobility    Modified Rankin (Stroke Patients Only)       Balance Overall balance assessment: Needs assistance Sitting-balance support: No upper extremity supported, Feet supported Sitting balance-Leahy Scale: Good Sitting balance - Comments: steady sitting reaching within BOS with L UE   Standing balance support: Single extremity supported, During functional activity Standing balance-Leahy Scale: Poor Standing balance comment: pt requiring assist for static standing balance with L UE support                            Cognition Arousal/Alertness: Awake/alert Behavior During  Therapy: Anxious Overall Cognitive Status: Within Functional Limits for tasks assessed                                 General Comments: Talkative; tangential; appearing anxious        Exercises  General Exercises - Lower Extremity Long Arc Quad: AROM, Strengthening, Both, 10 reps, Seated Hip Flexion/Marching: AROM, Strengthening, Both, 10 reps, Seated    General Comments General comments (skin integrity, edema, etc.): R UE in sling (therapist adjusted sling to improve positioning/fit--pt reporting improved comfort).  Nursing cleared pt for participation in physical therapy.  Pt agreeable to PT session.      Pertinent Vitals/Pain Pain Assessment Pain Assessment: Faces Faces Pain Scale: Hurts a little bit Pain Location: R shoulder Pain Descriptors / Indicators: Sore Pain Intervention(s): Limited activity within patient's tolerance, Monitored during session, Repositioned HR 108-127 bpm and O2 sats WFL on room air during sessions activities.    Home Living                          Prior Function            PT Goals (current goals can now be found in the care plan section) Acute Rehab PT Goals Patient Stated Goal: to improve balance and strength PT Goal Formulation: With patient Time For Goal Achievement: 08/10/21 Potential to Achieve Goals: Good Progress towards PT goals: Progressing toward goals    Frequency    7X/week      PT Plan Current plan remains appropriate    Co-evaluation              AM-PAC PT "6 Clicks" Mobility   Outcome Measure  Help needed turning from your back to your side while in a flat bed without using bedrails?: A Little Help needed moving from lying on your back to sitting on the side of a flat bed without using bedrails?: A Lot Help needed moving to and from a bed to a chair (including a wheelchair)?: A Lot Help needed standing up from a chair using your arms (e.g., wheelchair or bedside chair)?: A Lot Help needed to walk in hospital room?: A Lot Help needed climbing 3-5 steps with a railing? : Total 6 Click Score: 12    End of Session Equipment Utilized During Treatment: Gait belt;Other (comment) (R UE in shoulder  sling) Activity Tolerance: Patient tolerated treatment well Patient left: in bed;with call bell/phone within reach;with family/visitor present;with bed alarm set;with nursing/sitter in room Nurse Communication: Mobility status;Precautions PT Visit Diagnosis: Unsteadiness on feet (R26.81);Other abnormalities of gait and mobility (R26.89);Muscle weakness (generalized) (M62.81);History of falling (Z91.81);Repeated falls (R29.6);Pain Pain - Right/Left: Right Pain - part of body: Shoulder     Time: 0922-1000 PT Time Calculation (min) (ACUTE ONLY): 38 min  Charges:  $Gait Training: 8-22 mins $Therapeutic Exercise: 8-22 mins $Therapeutic Activity: 8-22 mins                    Leitha Bleak, PT 07/28/21, 10:59 AM

## 2021-07-28 NOTE — ED Notes (Signed)
Pt husband states that he gave pt tylenol "around lunch time". This RN explained to husband that he is NOT to give pt home medication & explained that we must know what was given & at what times & that home medications may not be given to pt by him. Husband v/u.

## 2021-07-28 NOTE — ED Notes (Signed)
Pt assisted to toilet & instructed to pull the cord when she is through.

## 2021-07-28 NOTE — ED Notes (Signed)
Went to give pt her morning medications and she stated she had already taken them from her home supply- this RN educated why we prefer to give pt's her meds from our supply- pt agreeable to that from now on

## 2021-07-29 NOTE — ED Notes (Signed)
Pt resting in bed. Appears to be sleeping at this time. Chest is rising and falling symmetrically. No acute distress noted. Will continue to monitor.  Family remains at bedside.  °

## 2021-07-29 NOTE — ED Notes (Signed)
TOC placement 

## 2021-07-29 NOTE — ED Provider Notes (Signed)
Today's Vitals   07/29/21 0015 07/29/21 0115 07/29/21 0215 07/29/21 0315  BP:      Pulse: 77 72 77 84  Resp:      Temp:      TempSrc:      SpO2: 96% 95% 94% 94%  Weight:      Height:      PainSc:       Body mass index is 38.78 kg/m.   Patient is sleeping.  There have been no acute events overnight.  Awaiting social work disposition.   Jandiel Magallanes, Delice Bison, DO 07/29/21 [phone]

## 2021-07-29 NOTE — ED Notes (Addendum)
Went to give pt morning medications and pt states she once again took them already from her home supply- pt states this is so she does not get off track with her medications as she normally takes them at 0730 every morning

## 2021-07-29 NOTE — Progress Notes (Signed)
Physical Therapy Treatment Patient Details Name: Debbie Wyatt MRN: [id number] DOB: December 17, 1945 Today's Date: 07/29/2021   History of Present Illness Pt is a 76 y.o. female presenting to ED with persistent pain and difficulty with ADL's after a R proximal humerus fx (occurred 6 days prior); interested in rehab placement.  PMH includes a-fib, htn, DM, and glaucoma.    PT Comments    Pt resting in bed upon PT arrival; agreeable to PT session; pt's husband present during session.  Pt appearing anxious and talkative during session requiring redirection intermittently.  During session pt SBA to min assist with bed mobility; min assist with transfers; and min assist (occasionally mod assist) ambulating 140 feet with single hand hold assist.  Performed standing static and dynamic reaching activities requiring CGA to min assist for balance.  Pt still appearing shaky in general today with activities but shakiness and assist levels improved compared to yesterdays session.  Will continue to focus on strengthening, balance, and progressive functional mobility per pt tolerance.    Recommendations for follow up therapy are one component of a multi-disciplinary discharge planning process, led by the attending physician.  Recommendations may be updated based on patient status, additional functional criteria and insurance authorization.  Follow Up Recommendations  Skilled nursing-short term rehab (<3 hours/day)     Assistance Recommended at Discharge Frequent or constant Supervision/Assistance  Patient can return home with the following A lot of help with walking and/or transfers;A lot of help with bathing/dressing/bathroom;Assistance with cooking/housework;Assist for transportation;Help with stairs or ramp for entrance   Equipment Recommendations  BSC/3in1;Wheelchair cushion (measurements PT);Wheelchair (measurements PT)    Recommendations for Other Services OT consult     Precautions / Restrictions  Precautions Precautions: Fall Required Braces or Orthoses: Sling Restrictions Weight Bearing Restrictions: Yes RUE Weight Bearing: Non weight bearing     Mobility  Bed Mobility Overal bed mobility: Needs Assistance Bed Mobility: Supine to Sit, Sit to Supine     Supine to sit: Min assist, HOB elevated Sit to supine: HOB elevated, Supervision   General bed mobility comments: vc's for technique; increased effort and time for pt to perform on own (getting in/OOB on L side)    Transfers Overall transfer level: Needs assistance Equipment used: None Transfers: Sit to/from Stand Sit to Stand: Min assist           General transfer comment: x6 trials from bed and x1 trial from toilet (use of grab bar); vc's for technique; min assist to steady once standing    Ambulation/Gait Ambulation/Gait assistance: Min assist, Mod assist Gait Distance (Feet): 140 Feet Assistive device: 1 person hand held assist   Gait velocity: decreased     General Gait Details: pt mildly shaky with short shuffling steps requiring vc's to increase step length; assist to steady/for balance required (mostly min assist but occasionally mod assist for balance)   Stairs             Wheelchair Mobility    Modified Rankin (Stroke Patients Only)       Balance Overall balance assessment: Needs assistance Sitting-balance support: No upper extremity supported, Feet supported Sitting balance-Leahy Scale: Good Sitting balance - Comments: steady sitting reaching within BOS with L UE   Standing balance support: Single extremity supported, During functional activity Standing balance-Leahy Scale: Poor Standing balance comment: pt CGA to min assist for static standing balance (pt noted to be shaky in standing)  Cognition Arousal/Alertness: Awake/alert Behavior During Therapy: Anxious Overall Cognitive Status: Within Functional Limits for tasks assessed                                  General Comments: Talkative; tangential; appearing anxious        Exercises      General Comments General comments (skin integrity, edema, etc.): R UE in sling (therapist adjusted sling and positioned sling on outside of gown for improved comfort and to prevent skin breakdown).  Nursing cleared pt for participation in physical therapy.  Pt agreeable to PT session.      Pertinent Vitals/Pain Pain Assessment Faces Pain Scale: Hurts a little bit Pain Location: R shoulder Pain Descriptors / Indicators: Sore Pain Intervention(s): Limited activity within patient's tolerance, Monitored during session, Repositioned Vitals (HR and O2 on room air) stable and WFL throughout treatment session.    Home Living                          Prior Function            PT Goals (current goals can now be found in the care plan section) Acute Rehab PT Goals Patient Stated Goal: to improve balance and strength PT Goal Formulation: With patient Time For Goal Achievement: 08/10/21 Potential to Achieve Goals: Good Progress towards PT goals: Progressing toward goals    Frequency    7X/week      PT Plan Current plan remains appropriate    Co-evaluation              AM-PAC PT "6 Clicks" Mobility   Outcome Measure  Help needed turning from your back to your side while in a flat bed without using bedrails?: A Little Help needed moving from lying on your back to sitting on the side of a flat bed without using bedrails?: A Little Help needed moving to and from a bed to a chair (including a wheelchair)?: A Little Help needed standing up from a chair using your arms (e.g., wheelchair or bedside chair)?: A Little Help needed to walk in hospital room?: A Lot Help needed climbing 3-5 steps with a railing? : A Lot 6 Click Score: 16    End of Session Equipment Utilized During Treatment: Gait belt;Other (comment) (R UE in shoulder sling) Activity  Tolerance: Patient tolerated treatment well Patient left: in bed;with call bell/phone within reach;with family/visitor present;with bed alarm set Nurse Communication: Mobility status;Precautions PT Visit Diagnosis: Unsteadiness on feet (R26.81);Other abnormalities of gait and mobility (R26.89);Muscle weakness (generalized) (M62.81);History of falling (Z91.81);Repeated falls (R29.6);Pain Pain - Right/Left: Right Pain - part of body: Shoulder     Time: 2263-3354 PT Time Calculation (min) (ACUTE ONLY): 44 min  Charges:  $Gait Training: 8-22 mins $Therapeutic Exercise: 8-22 mins $Therapeutic Activity: 8-22 mins                    Leitha Bleak, PT 07/29/21, 12:47 PM

## 2021-07-30 LAB — RESP PANEL BY RT-PCR (FLU A&B, COVID) ARPGX2
Influenza A by PCR: NEGATIVE
Influenza B by PCR: NEGATIVE
SARS Coronavirus 2 by RT PCR: NEGATIVE

## 2021-07-30 LAB — CBG MONITORING, ED: Glucose-Capillary: 98 mg/dL (ref 70–99)

## 2021-07-30 NOTE — Progress Notes (Signed)
Physical Therapy Treatment Patient Details Name: Debbie Wyatt MRN: [id number] DOB: 02-05-1946 Today's Date: 07/30/2021   History of Present Illness Pt is a 76 y.o. female presenting to ED with persistent pain and difficulty with ADL's after a R proximal humerus fx (occurred 6 days prior); interested in rehab placement.  PMH includes a-fib, htn, DM, and glaucoma.    PT Comments    Pt resting in recliner upon PT arrival; pt's husband present.  Min assist with transfers with vc's for technique (assist for balance standing).   For ambulation (160 feet total), pt initially using 1 person hand hold assist (min assist) first 60 feet of ambulation but progressed to no UE support (vc's for L UE arm swing) with CGA to min assist but pt with 3 loss of balance during ambulation without UE support requiring min to mod assist to regain balance.  Performed static standing balance activities (eyes open and closed with feet shoulder width apart and more narrow BOS) and pt requiring assist for balance (see below for details).  Will continue to focus on strengthening, balance, and progressive functional mobility during hospitalization.   Recommendations for follow up therapy are one component of a multi-disciplinary discharge planning process, led by the attending physician.  Recommendations may be updated based on patient status, additional functional criteria and insurance authorization.  Follow Up Recommendations  Skilled nursing-short term rehab (<3 hours/day)     Assistance Recommended at Discharge Frequent or constant Supervision/Assistance  Patient can return home with the following A lot of help with walking and/or transfers;A lot of help with bathing/dressing/bathroom;Assistance with cooking/housework;Assist for transportation;Help with stairs or ramp for entrance   Equipment Recommendations  BSC/3in1;Wheelchair cushion (measurements PT);Wheelchair (measurements PT)    Recommendations for Other  Services OT consult     Precautions / Restrictions Precautions Precautions: Fall Required Braces or Orthoses: Sling Restrictions Weight Bearing Restrictions: Yes RUE Weight Bearing: Non weight bearing     Mobility  Bed Mobility               General bed mobility comments: Deferred (pt in recliner beginning/end of session)    Transfers Overall transfer level: Needs assistance Equipment used: None Transfers: Sit to/from Stand Sit to Stand: Min assist           General transfer comment: x3 trials from recliner; vc's for L UE use during transfers; assist to steady    Ambulation/Gait Ambulation/Gait assistance: Min guard, Min assist, Mod assist Gait Distance (Feet): 160 Feet Assistive device: 1 person hand held assist, None   Gait velocity: decreased     General Gait Details: initially using 1 person hand hold assist first 60 feet of ambulation but progressed to no UE support (vc's for L UE arm swing) but pt with 3 loss of balance during ambulation without UE support requiring min to mod assist to regain balance; vc's for longer step length   Stairs             Wheelchair Mobility    Modified Rankin (Stroke Patients Only)       Balance Overall balance assessment: Needs assistance Sitting-balance support: No upper extremity supported, Feet supported Sitting balance-Leahy Scale: Good Sitting balance - Comments: steady sitting reaching within BOS with L UE   Standing balance support: No upper extremity supported Standing balance-Leahy Scale: Fair Standing balance comment: CGA for static standing balance               High Level Balance Comments: 2x30 seconds static  standing eyes open (feet shoulder width apart) with CGA for safety (pt mildly shaky); 2x30 seconds static standing eyes closed (feet shoulder width apart) with CGA to min assist for balance (pt mild to moderately shaky requiring assist for balance); x30 seconds standing with more  narrow BOS with eyes open CGA to min assist (pt unable to get feet close together though) and then with eyes closed x30 seconds with min to mod assist for balance            Cognition Arousal/Alertness: Awake/alert Behavior During Therapy: Anxious Overall Cognitive Status: Within Functional Limits for tasks assessed                                 General Comments: Talkative; tangential; appearing anxious        Exercises      General Comments General comments (skin integrity, edema, etc.): R UE in sling      Pertinent Vitals/Pain Pain Assessment Pain Assessment: No/denies pain Pain Score: 5  Pain Location: R shoulder Pain Descriptors / Indicators: Sore, Discomfort Pain Intervention(s): Limited activity within patient's tolerance, Monitored during session, Repositioned Vitals (HR and O2 on room air) stable and WFL throughout treatment session.    Home Living                          Prior Function            PT Goals (current goals can now be found in the care plan section) Acute Rehab PT Goals Patient Stated Goal: to improve balance and strength PT Goal Formulation: With patient Time For Goal Achievement: 08/10/21 Potential to Achieve Goals: Good Progress towards PT goals: Progressing toward goals    Frequency    7X/week      PT Plan Current plan remains appropriate    Co-evaluation              AM-PAC PT "6 Clicks" Mobility   Outcome Measure  Help needed turning from your back to your side while in a flat bed without using bedrails?: A Little Help needed moving from lying on your back to sitting on the side of a flat bed without using bedrails?: A Little Help needed moving to and from a bed to a chair (including a wheelchair)?: A Little Help needed standing up from a chair using your arms (e.g., wheelchair or bedside chair)?: A Little Help needed to walk in hospital room?: A Lot Help needed climbing 3-5 steps with a  railing? : A Lot 6 Click Score: 16    End of Session Equipment Utilized During Treatment: Gait belt;Other (comment) (R UE in shoulder sling) Activity Tolerance: Patient tolerated treatment well Patient left: in chair;with call bell/phone within reach;with family/visitor present Nurse Communication: Mobility status;Precautions PT Visit Diagnosis: Unsteadiness on feet (R26.81);Other abnormalities of gait and mobility (R26.89);Muscle weakness (generalized) (M62.81);History of falling (Z91.81);Repeated falls (R29.6);Pain Pain - Right/Left: Right Pain - part of body: Shoulder     Time: 7322-0254 PT Time Calculation (min) (ACUTE ONLY): 38 min  Charges:  $Gait Training: 8-22 mins $Therapeutic Exercise: 8-22 mins $Therapeutic Activity: 8-22 mins                    Leitha Bleak, PT 07/30/21, 12:14 PM

## 2021-07-30 NOTE — ED Provider Notes (Signed)
----------------------------------------- °  5:23 AM on 07/30/2021 -----------------------------------------   Blood pressure 127/64, pulse 78, temperature 97.9 F (36.6 C), temperature source Oral, resp. rate 14, height 5\' 2"  (1.575 m), weight 96.2 kg, SpO2 98 %.  The patient is calm and cooperative at this time.  There have been no acute events since the last update.  Awaiting disposition plan from Social Work team.   Paulette Blanch, MD 07/30/21 [id number]

## 2021-07-30 NOTE — ED Notes (Signed)
Pt sleeping. 

## 2021-07-30 NOTE — ED Notes (Signed)
Pt is taking her home meds now.  Family with pt.  Pt alert.

## 2021-07-30 NOTE — ED Notes (Signed)
Pt is taking her own meds.

## 2021-07-30 NOTE — ED Notes (Signed)
Pt alert, sitting in recliner.  Family with pt.

## 2021-07-30 NOTE — ED Notes (Signed)
Resumed care from [facility].  Pt in recliner.  Family with pt.  Pt eating dinner meal.  Pt alert and oriented.

## 2021-07-30 NOTE — TOC Progression Note (Signed)
Transition of Care [facility]) - Progression Note    Patient Details  Name: Debbie Wyatt MRN: [id number] Date of Birth: [birth year]  Transition [facility]) CM/SW Contact  Shelbie Hutching, RN Phone Number: 07/30/2021, 12:51 PM  Clinical Narrative:    Insurance authorization started.    Expected Discharge Plan: Skilled Nursing Facility Barriers to Discharge: SNF Pending bed offer, ED SNF auth  Expected Discharge Plan and Services Expected Discharge Plan: [place]   Discharge Planning Services: CM Consult Post Acute Care Choice: [place] Living arrangements for the past 2 months: Single Family Home                 DME Arranged: N/A DME Agency: NA       HH Arranged: NA HH Agency: NA         Social Determinants of Health (SDOH) Interventions    Readmission Risk Interventions No flowsheet data found.

## 2021-07-30 NOTE — ED Notes (Signed)
Pt to bathroom with assistance.

## 2021-07-30 NOTE — TOC Progression Note (Signed)
Transition of Care [facility]) - Progression Note    Patient Details  Name: Debbie Wyatt MRN: [id number] Date of Birth: 1946/03/22  Transition of Care [facility]) CM/SW Contact  Shelbie Hutching, RN Phone Number: 07/30/2021, 9:58 AM  Clinical Narrative:    Patient and family have accepted bed offer for [place].  COVID test ordered and RNCM starting insurance authorization.  [facility] can accept patient tomorrow.   Family will transport - husband has a Lucianne Lei that patient can get in and out of easily.     Expected Discharge Plan: Skilled Nursing Facility Barriers to Discharge: SNF Pending bed offer, ED SNF auth  Expected Discharge Plan and Services Expected Discharge Plan: [place]   Discharge Planning Services: CM Consult Post Acute Care Choice: [place] Living arrangements for the past 2 months: Single Family Home                 DME Arranged: N/A DME Agency: NA       HH Arranged: NA HH Agency: NA         Social Determinants of Health (SDOH) Interventions    Readmission Risk Interventions No flowsheet data found.

## 2021-07-30 NOTE — ED Notes (Signed)
Pt resting quietly in bed.

## 2021-07-30 NOTE — Progress Notes (Signed)
Occupational Therapy Treatment Patient Details Name: Destane Speas MRN: [id number] DOB: 05-Jan-1946 Today's Date: 07/30/2021   History of present illness Pt is a 76 y.o. female presenting to ED with persistent pain and difficulty with ADL's after a R proximal humerus fx (occurred 6 days prior); interested in rehab placement.  PMH includes a-fib, htn, DM, and glaucoma.   OT comments  Upon entering the room, pt supine in bed with husband present in room and agreeable to OT intervention. Pt is able to verbalize precautions and maintain during session without cuing. Pt performed bed mobility with min A to EOB. Pt with very tangential speech and needing to be redirected frequently throughout session. Pt is perseverating on bathing even though she reports she was able to engage in this task prior to therapist arrival. Pt ambulating 120' progressing from min A to min guard with cuing to move L LE at side naturally. Pt appears anxious throughout. Pt demonstrates toilet transfer with min A and min A for clothing management and hygiene. Pt returns to sit in recliner chair and reports fatigue from session. Pt continues to benefit from OT intervention.    Recommendations for follow up therapy are one component of a multi-disciplinary discharge planning process, led by the attending physician.  Recommendations may be updated based on patient status, additional functional criteria and insurance authorization.    Follow Up Recommendations  Skilled nursing-short term rehab (<3 hours/day)    Assistance Recommended at Discharge Frequent or constant Supervision/Assistance  Patient can return home with the following  A little help with walking and/or transfers;A little help with bathing/dressing/bathroom   Equipment Recommendations  BSC/3in1;Tub/shower seat       Precautions / Restrictions Precautions Precautions: Fall Required Braces or Orthoses: Sling Restrictions Weight Bearing Restrictions: Yes RUE  Weight Bearing: Non weight bearing       Mobility Bed Mobility Overal bed mobility: Needs Assistance Bed Mobility: Supine to Sit     Supine to sit: Min assist, HOB elevated          Transfers Overall transfer level: Needs assistance Equipment used: 1 person hand held assist Transfers: Sit to/from Stand Sit to Stand: Min assist                 Balance Overall balance assessment: Needs assistance Sitting-balance support: No upper extremity supported, Feet supported Sitting balance-Leahy Scale: Good     Standing balance support: Single extremity supported, During functional activity Standing balance-Leahy Scale: Fair Standing balance comment: CGA for standing balance                           ADL either performed or assessed with clinical judgement   ADL Overall ADL's : Needs assistance/impaired                 Upper Body Dressing : Minimal assistance;Sitting Upper Body Dressing Details (indicate cue type and reason): to don hospital gown     Toilet Transfer: Minimal assistance;Regular Toilet   Toileting- Clothing Manipulation and Hygiene: Minimal assistance;Sit to/from stand       Functional mobility during ADLs: Min guard;Minimal assistance      Extremity/Trunk Assessment Upper Extremity Assessment RUE Deficits / Details: R UE in shoulder immobilizer            Vision Baseline Vision/History: 1 Wears glasses Patient Visual Report: No change from baseline            Cognition Arousal/Alertness: Awake/alert Behavior During  Therapy: Anxious Overall Cognitive Status: Within Functional Limits for tasks assessed                                 General Comments: Talkative; tangential; appearing anxious                   Pertinent Vitals/ Pain       Pain Assessment Pain Assessment: 0-10 Pain Score: 7  Pain Location: R shoulder Pain Descriptors / Indicators: Sore, Discomfort Pain Intervention(s): Limited  activity within patient's tolerance, Monitored during session, Repositioned, Premedicated before session         Frequency  Min 2X/week        Progress Toward Goals  OT Goals(current goals can now be found in the care plan section)  Progress towards OT goals: Progressing toward goals  Acute Rehab OT Goals Patient Stated Goal: to go to rehab OT Goal Formulation: With patient Time For Goal Achievement: 08/11/21 Potential to Achieve Goals: Good  Plan Discharge plan remains appropriate;Frequency remains appropriate       AM-PAC OT "6 Clicks" Daily Activity     Outcome Measure   Help from another person eating meals?: None Help from another person taking care of personal grooming?: A Little Help from another person toileting, which includes using toliet, bedpan, or urinal?: A Lot Help from another person bathing (including washing, rinsing, drying)?: A Lot Help from another person to put on and taking off regular upper body clothing?: A Little Help from another person to put on and taking off regular lower body clothing?: A Lot 6 Click Score: 16    End of Session Equipment Utilized During Treatment: Other (comment) (HHA)  OT Visit Diagnosis: Unsteadiness on feet (R26.81);Repeated falls (R29.6);Muscle weakness (generalized) (M62.81)   Activity Tolerance Patient tolerated treatment well   Patient Left in bed;with call bell/phone within reach;with bed alarm set;with family/visitor present   Nurse Communication Mobility status        Time: 0240-9735 OT Time Calculation (min): 30 min  Charges: OT General Charges $OT Visit: 1 Visit OT Treatments $Self Care/Home Management : 8-22 mins $Therapeutic Activity: 8-22 mins  Darleen Crocker, MS, OTR/L , CBIS ascom [phone]  07/30/21, 12:35 PM

## 2021-07-31 NOTE — ED Notes (Signed)
Assisted pt to toilet, helped with washing of pts face and putting on deodorant, assisted pt back in bed. Pt also give a cup of ice water.

## 2021-07-31 NOTE — TOC Progression Note (Signed)
Transition of Care [facility]) - Progression Note    Patient Details  Name: Debbie Wyatt MRN: [id number] Date of Birth: September 05, 1945  Transition of [facility]) CM/SW Contact  Shelbie Hutching, RN Phone Number: 07/31/2021, 12:41 PM  Clinical Narrative:    Insurance authorization approved. Josem Kaufmann ID [id number] Reference [id number] Approved 2/21-2/23   Expected Discharge Plan: Skilled Nursing Facility Barriers to Discharge: SNF Pending bed offer, ED SNF auth  Expected Discharge Plan and Services Expected Discharge Plan: [place]   Discharge Planning Services: CM Consult Post Acute Care Choice: [place] Living arrangements for the past 2 months: Single Family Home                 DME Arranged: N/A DME Agency: NA       HH Arranged: NA HH Agency: NA         Social Determinants of Health (SDOH) Interventions    Readmission Risk Interventions No flowsheet data found.

## 2021-07-31 NOTE — ED Provider Notes (Signed)
----------------------------------------- °  5:32 AM on 07/31/2021 -----------------------------------------   Blood pressure (!) 112/50, pulse 75, temperature 98.2 F (36.8 C), temperature source Oral, resp. rate 18, height 5\' 2"  (1.575 m), weight 96.2 kg, SpO2 97 %.  The patient is calm and cooperative at this time.  There have been no acute events since the last update.  Awaiting disposition plan from Social Work team.   Paulette Blanch, MD 07/31/21 [phone]

## 2021-07-31 NOTE — ED Notes (Signed)
Pt in bed doing her exercises. Family at bedside. She is awaiting her authorization to go [facility] for rehab

## 2021-07-31 NOTE — ED Notes (Signed)
Pt moved to room 2   report off to Jud rn.

## 2021-07-31 NOTE — TOC Progression Note (Signed)
Transition of Care [facility]) - Progression Note    Patient Details  Name: Debbie Wyatt MRN: [id number] Date of Birth: [birth year]  Transition of Care [facility]) CM/SW Contact  Shelbie Hutching, RN Phone Number: 07/31/2021, 11:46 AM  Clinical Narrative:    Insurance authorization is still pending, called [employer] for update and it is under medical review.     Expected Discharge Plan: Skilled Nursing Facility Barriers to Discharge: SNF Pending bed offer, ED SNF auth  Expected Discharge Plan and Services Expected Discharge Plan: [place]   Discharge Planning Services: CM Consult Post Acute Care Choice: [place] Living arrangements for the past 2 months: Single Family Home                 DME Arranged: N/A DME Agency: NA       HH Arranged: NA HH Agency: NA         Social Determinants of Health (SDOH) Interventions    Readmission Risk Interventions No flowsheet data found.

## 2021-07-31 NOTE — ED Notes (Signed)
Pt up to bed side toilet with writers assistance to urinate and assisted back into bed with bed placed at lowest position, rails x3 up and call bell with in reach with provided education on use.

## 2022-05-15 ENCOUNTER — Other Ambulatory Visit: Payer: Self-pay | Admitting: Family Medicine

## 2022-05-15 DIAGNOSIS — Z780 Asymptomatic menopausal state: Secondary | ICD-10-CM

## 2022-05-15 DIAGNOSIS — Z1231 Encounter for screening mammogram for malignant neoplasm of breast: Secondary | ICD-10-CM

## 2022-07-02 ENCOUNTER — Ambulatory Visit
Admission: RE | Admit: 2022-07-02 | Discharge: 2022-07-02 | Disposition: A | Discharge status: Home / Self Care | Payer: Medicare HMO | Source: Ambulatory Visit | Attending: Family Medicine | Admitting: Family Medicine

## 2022-07-02 DIAGNOSIS — Z1231 Encounter for screening mammogram for malignant neoplasm of breast: Secondary | ICD-10-CM | POA: Diagnosis not present

## 2022-07-02 DIAGNOSIS — Z780 Asymptomatic menopausal state: Secondary | ICD-10-CM | POA: Diagnosis present

## 2022-08-07 DIAGNOSIS — I480 Paroxysmal atrial fibrillation: Secondary | ICD-10-CM | POA: Insufficient documentation

## 2022-08-07 NOTE — Progress Notes (Signed)
Cardiology Office Note  Date:  08/09/2022   ID:  [place], Cush 1945-08-19, MRN [id number]  PCP:  Gayland Curry, MD   Chief Complaint  Patient presents with   New Patient (Initial Visit)    Establish care for A-Fib & WPW; former patient of Dr. Ubaldo Glassing. Patient is off balance most days. Medications reviewed by the patient verbally.     HPI:  [name] is a 77 year old woman with past medical history of WP W syndrome Paroxysmal atrial fibrillation on dofetilide with Eliquis, prior cardioversion Essential hypertension Previously followed by cardiology at [facility] last seen October 2023  Reports doing well on today's visit, has been on dofetilide for over 10 years Prescribed by Dr. Ubaldo Glassing who has since retired On last clinic visit to the office was in atrial fibrillation October 2023. No follow-up since that time  Takes extra metoprolol for paroxysmal A-fib On dofetilide 250 mg twice a day In general reports she is asymptomatic from her arrhythmia Does not check heart rate or rhythm on a regular basis  Previously followed by EP at [facility] By her report they signed off on her and told her to follow-up with general cardiology in [facility]  Significant gait instability dislocated right shoulder and broken humerous on the right side after a mechanical fall , shot in shoulder every 3 months Walks with a cane, goes to [facility] [facility] in [place] Swims daily at gym  Hx of hot flashes, sweating  Labs reviewed: Total chol 135, LDL 70,  CR 0.8  EKG personally reviewed by myself on todays visit NSR rate 67 bpm, no ST or T wave changes, QT 436 ms   PMH:   has a past medical history of Asthma, Diabetes mellitus without complication ([place]), Glaucoma, Hyperlipidemia, Hypertension, Skin cancer, TIA (transient ischemic attack), and WPW (Wolff-Parkinson-White syndrome).  PSH:    Past Surgical History:  Procedure Laterality Date   BACK SURGERY     x 2   BREAST BIOPSY Left    neg  15 yrs ago   BREAST EXCISIONAL BIOPSY Left    cataract surgery     CHOLECYSTECTOMY  1991   SEGMENTECOMY      Current Outpatient Medications  Medication Sig Dispense Refill   acetaminophen-codeine (TYLENOL #3) 300-30 MG tablet Take 1 tablet by mouth as needed.     albuterol (PROAIR HFA) 108 (90 BASE) MCG/ACT inhaler Inhale 2 puffs into the lungs every 6 (six) hours as needed for wheezing (use 10 minutes before exercise). 1 Inhaler 5   atorvastatin (LIPITOR) 40 MG tablet Take 80 mg by mouth daily.      Azelastine HCl 0.15 % SOLN Place 1 puff into the nose daily.     brimonidine (ALPHAGAN) 0.15 % ophthalmic solution Place 1 drop into both eyes 3 (three) times daily.     clobetasol (TEMOVATE) 0.05 % external solution Apply 1 application topically daily.     cyclobenzaprine (FLEXERIL) 10 MG tablet Take 10 mg by mouth 3 (three) times daily.     FLOVENT DISKUS 250 MCG/ACT AEPB Inhale 1 puff into the lungs in the morning and at bedtime.     FLUoxetine (PROZAC) 40 MG capsule Take 1 capsule (40 mg total) by mouth daily.  3   fluticasone (FLONASE) 50 MCG/ACT nasal spray Place 2 sprays into both nostrils daily.     latanoprost (XALATAN) 0.005 % ophthalmic solution Place 1 drop into both eyes daily.     levocetirizine (XYZAL) 5 MG tablet Take 5 mg  by mouth every evening.     lisinopril (PRINIVIL,ZESTRIL) 10 MG tablet Take 10 mg by mouth daily.     metFORMIN (GLUCOPHAGE) 500 MG tablet Take 1 tablet by mouth daily.     Metoprolol Succinate 25 MG CS24 Take 2 tablets in the am & 1 tablet in the pm     montelukast (SINGULAIR) 10 MG tablet Take 10 mg by mouth at bedtime.     Multiple Vitamin (MULTIVITAMIN WITH MINERALS) TABS tablet Take 1 tablet by mouth daily.     omeprazole (PRILOSEC) 40 MG capsule Take 40 mg by mouth daily.     Potassium 99 MG TABS Take 1 tablet by mouth daily.      apixaban (ELIQUIS) 5 MG TABS tablet Take 1 tablet (5 mg total) by mouth 2 (two) times daily. 180 tablet 3   dofetilide  (TIKOSYN) 250 MCG capsule Take 1 capsule (250 mcg total) by mouth 2 (two) times daily. 180 capsule 1   No current facility-administered medications for this visit.    Allergies:   Oxycodone-acetaminophen, Amoxicillin, Oxycodone, Percocet [oxycodone-acetaminophen], Amoxicillin-pot clavulanate, and Valdecoxib   Social History:  The patient  reports that she has never smoked. She has never used smokeless tobacco. She reports that she does not drink alcohol and does not use drugs.   Family History:   family history includes Breast cancer in her paternal aunt; Emphysema in her father; Heart disease in her mother.    Review of Systems: Review of Systems  Constitutional: Negative.   HENT: Negative.    Respiratory: Negative.    Cardiovascular: Negative.   Gastrointestinal: Negative.   Musculoskeletal: Negative.   Neurological: Negative.   Psychiatric/Behavioral: Negative.    All other systems reviewed and are negative.    PHYSICAL EXAM: VS:  BP 130/62 (BP Location: Left Arm, Patient Position: Sitting, Cuff Size: Large)   Pulse 67   Ht '5\' 2"'$  (1.575 m)   Wt 209 lb 2 oz (94.9 kg)   SpO2 96%   BMI 38.25 kg/m  , BMI Body mass index is 38.25 kg/m. GEN: Well nourished, well developed, in no acute distress HEENT: normal Neck: no JVD, carotid bruits, or masses Cardiac: RRR; no murmurs, rubs, or gallops,no edema  Respiratory:  clear to auscultation bilaterally, normal work of breathing GI: soft, nontender, nondistended, + BS MS: no deformity or atrophy Skin: warm and dry, no rash Neuro:  Strength and sensation are intact Psych: euthymic mood, full affect  Recent Labs: No results found for requested labs within last 365 days.    Lipid Panel No results found for: "CHOL", "HDL", "LDLCALC", "TRIG"    Wt Readings from Last 3 Encounters:  08/09/22 209 lb 2 oz (94.9 kg)  07/27/21 212 lb (96.2 kg)  04/09/18 214 lb 14.4 oz (97.5 kg)     ASSESSMENT AND PLAN:  Problem List Items  Addressed This Visit       Cardiology Problems   Paroxysmal atrial fibrillation (HCC)   Relevant Medications   Metoprolol Succinate 25 MG CS24   dofetilide (TIKOSYN) 250 MCG capsule   apixaban (ELIQUIS) 5 MG TABS tablet   Other Relevant Orders   EKG 12-Lead   Ambulatory referral to Cardiac Electrophysiology   WPW (Wolff-Parkinson-White syndrome) - Primary   Relevant Medications   Metoprolol Succinate 25 MG CS24   dofetilide (TIKOSYN) 250 MCG capsule   apixaban (ELIQUIS) 5 MG TABS tablet   Other Relevant Orders   EKG 12-Lead   Ambulatory referral to Cardiac Electrophysiology  Hyperlipidemia   Relevant Medications   Metoprolol Succinate 25 MG CS24   dofetilide (TIKOSYN) 250 MCG capsule   apixaban (ELIQUIS) 5 MG TABS tablet   Other Visit Diagnoses     Morbid obesity ([place])       Gait instability          WPW/paroxysmal atrial fibrillation On metoprolol and dofetilide No change to medication, referral made to our EP QT interval 430, stable Stable lab work November 2023 On Eliquis 5 twice daily  Hyperlipidemia Recommend she continue her statin, cholesterol at goal  Gait instability Prior history of falls, trauma to right shoulder, working with PT  Morbid obesity We have encouraged continued exercise, careful diet management in an effort to lose weight.    Total encounter time more than 60 minutes  Greater than 50% was spent in counseling and coordination of care with the patient    Signed, Esmond Plants, M.D., Ph.D. [place], [place]

## 2022-08-09 ENCOUNTER — Ambulatory Visit: Payer: Medicare HMO | Attending: Cardiovascular Disease | Admitting: Cardiovascular Disease

## 2022-08-09 ENCOUNTER — Encounter: Payer: Self-pay | Admitting: Cardiovascular Disease

## 2022-08-09 VITALS — BP 130/62 | HR 67 | Ht 62.0 in | Wt 209.1 lb

## 2022-08-09 DIAGNOSIS — I480 Paroxysmal atrial fibrillation: Secondary | ICD-10-CM | POA: Diagnosis not present

## 2022-08-09 DIAGNOSIS — R2681 Unsteadiness on feet: Secondary | ICD-10-CM

## 2022-08-09 DIAGNOSIS — I456 Pre-excitation syndrome: Secondary | ICD-10-CM

## 2022-08-09 DIAGNOSIS — E782 Mixed hyperlipidemia: Secondary | ICD-10-CM

## 2022-08-09 MED ORDER — DOFETILIDE 250 MCG PO CAPS: 250.0000 ug | ORAL_CAPSULE | Freq: Two times a day (BID) | ORAL | 1 refills | Status: DC

## 2022-08-09 MED ORDER — APIXABAN 5 MG PO TABS: 5.0000 mg | ORAL_TABLET | Freq: Two times a day (BID) | ORAL | 3 refills | Status: DC

## 2022-08-09 NOTE — Patient Instructions (Addendum)
Medication Instructions:  No changes  If you need a refill on your cardiac medications before your next appointment, please call your pharmacy.   Lab work: No new labs needed  Testing/Procedures: No new testing needed  Follow-Up: At [facility], you and your health needs are our priority.  As part of our continuing mission to provide you with exceptional heart care, we have created designated Provider Care Teams.  These Care Teams include your primary Cardiologist (physician) and Advanced Practice Providers (APPs -  Physician Assistants and Nurse Practitioners) who all work together to provide you with the care you need, when you need it.  You will need a follow up appointment in 12 months  Providers on your designated Care Team:   Murray Hodgkins, NP Christell Faith, PA-C Cadence Kathlen Mody, [place]  COVID-19 Vaccine Information can be found at: [email or web address] For questions related to vaccine distribution or appointments, please email [email or web address] or call [phone].   Other: You are being referred to an electrophysiologist, Dr. Lars Mage. You will be scheduled to see him in 6 months.

## 2022-08-15 ENCOUNTER — Telehealth: Payer: Self-pay | Admitting: Cardiovascular Disease

## 2022-08-15 MED ORDER — DOFETILIDE 250 MCG PO CAPS: 250.0000 ug | ORAL_CAPSULE | Freq: Two times a day (BID) | ORAL | 1 refills | Status: DC

## 2022-08-15 MED ORDER — LISINOPRIL 10 MG PO TABS: 10.0000 mg | ORAL_TABLET | Freq: Every day | ORAL | 2 refills | Status: DC

## 2022-08-15 MED ORDER — METOPROLOL SUCCINATE 25 MG PO CS24: 25.0000 mg | EXTENDED_RELEASE_CAPSULE | Freq: Two times a day (BID) | ORAL | 1 refills | Status: DC

## 2022-08-15 NOTE — Telephone Encounter (Signed)
*  STAT* If patient is at the pharmacy, call can be transferred to refill team.   1. Which medications need to be refilled? (please list name of each medication and dose if known)  Dofetilide (TIKOSYN) 250 MCG capsule Metoprolol Succinate 25 MG CS24 Metoprolol Tartrate 25 MG Lisinopril (PRINIVIL,ZESTRIL) 10 MG tablet  2. Which pharmacy/location (including street and city if local pharmacy) is medication to be sent to? [facility]  3. Do they need a 30 day or 90 day supply?  90 day supply    *STAT* If patient is at the pharmacy, call can be transferred to refill team.   1. Which medications need to be refilled? (please list name of each medication and dose if known)  apixaban (ELIQUIS) 5 MG TABS tablet  2. Which pharmacy/location (including street and city if local pharmacy) is medication to be sent to? [id number] [place], [place] [address] 3. Do they need a 30 day or 90 day supply?  90 day supply

## 2022-08-15 NOTE — Telephone Encounter (Signed)
Please assist p/t with Eliquis refill, thank you.

## 2022-08-20 MED ORDER — DOFETILIDE 250 MCG PO CAPS: 250.0000 ug | ORAL_CAPSULE | Freq: Two times a day (BID) | ORAL | 2 refills | Status: DC

## 2022-08-20 MED ORDER — ATORVASTATIN CALCIUM 40 MG PO TABS: 80.0000 mg | ORAL_TABLET | Freq: Every day | ORAL | 2 refills | Status: DC

## 2022-08-20 MED ORDER — METOPROLOL SUCCINATE 25 MG PO CS24: 25.0000 mg | EXTENDED_RELEASE_CAPSULE | Freq: Two times a day (BID) | ORAL | 1 refills | Status: DC

## 2022-08-20 MED ORDER — METOPROLOL TARTRATE 25 MG PO TABS: 25.0000 mg | ORAL_TABLET | Freq: Every day | ORAL | 2 refills | Status: DC | PRN

## 2022-08-20 NOTE — Telephone Encounter (Signed)
Patient would like all mediaction below filled through [facility] mail order going forward.    Dofetilide (TIKOSYN) 250 MCG capsule Metoprolol Succinate 25 MG CS24 Metoprolol Tartrate 25 MG Lisinopril (PRINIVIL,ZESTRIL) 10 MG tablet

## 2022-08-20 NOTE — Telephone Encounter (Signed)
Called p/t to confirm use of Metoprolol Succinate '25MG'$ .  Left VM to return call to verify use of medication for break through Afib.

## 2022-08-20 NOTE — Addendum Note (Signed)
Addended by: Nestor Ramp on: 08/20/2022 02:39 PM   Modules accepted: Orders

## 2022-09-30 ENCOUNTER — Other Ambulatory Visit: Payer: Self-pay | Admitting: Cardiovascular Disease

## 2022-11-23 ENCOUNTER — Other Ambulatory Visit: Payer: Self-pay | Admitting: Cardiovascular Disease

## 2023-01-24 ENCOUNTER — Other Ambulatory Visit: Payer: Self-pay | Admitting: Neurology

## 2023-01-24 DIAGNOSIS — R296 Repeated falls: Secondary | ICD-10-CM

## 2023-01-24 DIAGNOSIS — R2689 Other abnormalities of gait and mobility: Secondary | ICD-10-CM

## 2023-01-28 ENCOUNTER — Ambulatory Visit
Admission: RE | Admit: 2023-01-28 | Discharge: 2023-01-28 | Disposition: A | Discharge status: Home / Self Care | Payer: Medicare HMO | Source: Ambulatory Visit | Attending: Neurology | Admitting: Neurology

## 2023-01-28 DIAGNOSIS — R296 Repeated falls: Secondary | ICD-10-CM | POA: Insufficient documentation

## 2023-01-28 DIAGNOSIS — R2689 Other abnormalities of gait and mobility: Secondary | ICD-10-CM | POA: Insufficient documentation

## 2023-01-30 ENCOUNTER — Telehealth: Payer: Self-pay | Admitting: Cardiovascular Disease

## 2023-01-30 NOTE — Telephone Encounter (Signed)
Pt states her afib is happening more frequently and she has been taking her medication as normal but asked to speak to a nurse, she stated she wanted to make sure she is okay to wait until she sees Dr. Lalla Brothers 02/13/23. I have placed her on the waitlist for a cancellation.

## 2023-01-30 NOTE — Telephone Encounter (Signed)
Called patient, she states that she is having more episodes. She can be sitting still and have her heart start pounding, she is aware of the WPW and the PAF can cause this, but she is noticing it more and more. She states she takes her Metoprolol Succinate as prescribed 2 tablets in the AM, and 1 in the PM.- she states she does use the Metoprolol Tartrate for breakthrough, she last took it an hour ago, and she normally can take it once and go about 2-3 days until having to take another Tartrate. She is continuing to take Tikosyn 1 tablet twice daily, and Eliquis 1 tablet twice daily. She does not have any symptoms to report (no SOB, CP, lightheaded, or dizziness) she does not check her HR/BP during these episodes. I did advise patient to start checking her HR during these episodes, as well as her BP. Patient states she will start doing this, she just recently received a new smart watch and it is notifying her more and more, this is the reason for the call. She is aware if she begins to have any symptoms above she should present to the ED for evaluation. Patient had last OV with Dr.Gollan on 03/01, and upcoming appointment with Dr.Lambert on 09/04, she was asking for a sooner appointment, I checked the schedule and nothing sooner opening. Advised patient we would let her know if anything became open. Advised I would route to MD to see for any further recommendations and would call back. Patient verbalized understanding.

## 2023-01-31 NOTE — Telephone Encounter (Signed)
Pt states that she was to receive another callback from nurse yesterday. She is calling to f/u on what she needs to do. Please advise

## 2023-01-31 NOTE — Telephone Encounter (Signed)
Returned the call to the patient. She stated that her heart rate goes from 88-120 according to her smart watch. She did state that she watches her heart rate on her watch and has been advised to try not to watch it so much as that can make her heart rate go up. She has her watch to alert her for a heart rate under 50 and over 100. She did not get any alerts during the night.  She has been taking the PRN Metoprolol Tartrate 1-2 daily for the past week which does decrease her heart rate to the 60's. She is asymptomatic.   She did say that she has been on prozac which has made her feel bad and is starting to wean off of that.

## 2023-02-03 NOTE — Telephone Encounter (Signed)
Called and spoke with patient. Informed her of the following from Dr. Mariah Milling.  Need some blood pressure measurements before we titrate additional medications  Thx  TGollan   Patient states that she does not have any blood pressures. Patient states that she will check her blood pressure over the next few days and call back with some blood pressure readings.

## 2023-02-12 ENCOUNTER — Ambulatory Visit: Payer: Medicare HMO | Attending: Cardiology | Admitting: Cardiology

## 2023-02-12 ENCOUNTER — Encounter: Payer: Self-pay | Admitting: Cardiology

## 2023-02-12 VITALS — BP 100/70 | HR 75 | Ht 62.0 in | Wt 199.2 lb

## 2023-02-12 DIAGNOSIS — I456 Pre-excitation syndrome: Secondary | ICD-10-CM | POA: Diagnosis not present

## 2023-02-12 DIAGNOSIS — Z79899 Other long term (current) drug therapy: Secondary | ICD-10-CM

## 2023-02-12 DIAGNOSIS — I480 Paroxysmal atrial fibrillation: Secondary | ICD-10-CM | POA: Diagnosis not present

## 2023-02-12 NOTE — H&P (View-Only) (Signed)
 Electrophysiology Office Note:    Date:  02/12/2023   ID:  Debbie Wyatt, [place] 03-08-1946, MRN [id number]  [facility] Cardiologist:  None  [facility] Electrophysiologist:  OLE ONEIDA HOLTS, MD   Referring MD: [name], MD   Chief Complaint: AF and WPW  History of Present Illness:    Debbie Wyatt is a 77 y.o. femalewho I am seeing today for an evaluation of AF and WPW at the request of Dr Perla.  The patient was last seen by Dr Gollan on 08/09/2022.  The patient has a medical history that includes WPW, AF, obesity. Previously followed by Dr Bosie at [facility].She was previously seen by [facility] but they signed off. She is maintained on dofetilide  and xarelto . She ambulates with a cane due to gait instability.  She saw Dr Lynnetta at [facility] previously. Based on chart review, AP was identified in 1987 EP study. This was managed conservatively with Atenolol.  Today she reviewed her entire arrhythmia history during the clinic appointment.  She usually can tell when she is out of rhythm.  She does monitor her heart rates and rhythm using an Apple watch.  Catheter ablation is been discussed with the patient but she is hesitant to pursue any invasive treatment options for her atrial fibrillation.         Their past medical, social and family history was reveiwed.   ROS:   Please see the history of present illness.    All other systems reviewed and are negative.  EKGs/Labs/Other Studies Reviewed:    The following studies were reviewed today:  08/09/2022 ECG shows sinus rhythm. No preexcitation.   EKG Interpretation Date/Time:  Wednesday February 12 2023 10:06:42 EDT Ventricular Rate:  75 PR Interval:    QRS Duration:  88 QT Interval:  410 QTC Calculation: 457 R Axis:   15  Text Interpretation: Atrial fibrillation Confirmed by HOLTS OLE [phone]) on 02/12/2023 10:24:14 AM    Physical Exam:    VS:  BP 100/70 (BP Location: Left Arm, Patient  Position: Sitting, Cuff Size: Normal)   Pulse 75   Ht 5\' 2"  (1.575 m)   Wt 199 lb 4 oz (90.4 kg)   SpO2 98%   BMI 36.44 kg/m     Wt Readings from Last 3 Encounters:  02/12/23 199 lb 4 oz (90.4 kg)  08/09/22 209 lb 2 oz (94.9 kg)  07/27/21 212 lb (96.2 kg)     GEN:  Well nourished, well developed in no acute distress.  Obese CARDIAC: Irregularly irregular, tachycardic, no murmurs, rubs, gallops RESPIRATORY:  Clear to auscultation without rales, wheezing or rhonchi       ASSESSMENT AND PLAN:    1. Paroxysmal atrial fibrillation (HCC)   2. WPW (Wolff-Parkinson-White syndrome)   3. Morbid obesity (HCC)   4. Encounter for long-term (current) use of high-risk medication     #Persistent AF #High risk med monitoring On dofetilide  but is out of rhythm on today's EKG. QTc on today's ECG acceptable. I would like to cardiovert her to see if she will maintain sinus rhythm.  I discussed the cardioversion procedure in detail with the patient including the risks and she wishes to proceed.  She has not missed any doses of Eliquis  in the last 4 weeks. Check BMP and Mag today.  #Hx of WPW No SVT episodes. No preexcitation on ECG.  Follow up 3 months with APP.   Signed, OLE ONEIDA. HOLTS, MD, [facility], [facility] 02/12/2023 10:24 AM  Electrophysiology [facility]

## 2023-02-12 NOTE — Patient Instructions (Signed)
Medication Instructions:  Your physician recommends that you continue on your current medications as directed. Please refer to the Current Medication list given to you today.  *If you need a refill on your cardiac medications before your next appointment, please call your pharmacy*   Lab Work: BMET, CBC, Mag If you have labs (blood work) drawn today and your tests are completely normal, you will receive your results only by: MyChart Message (if you have MyChart) OR A paper copy in the mail If you have any lab test that is abnormal or we need to change your treatment, we will call you to review the results.   Testing/Procedures: Your physician has recommended that you have a Cardioversion (DCCV). Electrical Cardioversion uses a jolt of electricity to your heart either through paddles or wired patches attached to your chest. This is a controlled, usually prescheduled, procedure. Defibrillation is done under light anesthesia in the hospital, and you usually go home the day of the procedure. This is done to get your heart back into a normal rhythm. You are not awake for the procedure. Please see the instruction sheet given to you today.                                  Follow-Up: At [facility], you and your health needs are our priority.  As part of our continuing mission to provide you with exceptional heart care, we have created designated Provider Care Teams.  These Care Teams include your primary Cardiologist (physician) and Advanced Practice Providers (APPs -  Physician Assistants and Nurse Practitioners) who all work together to provide you with the care you need, when you need it.  Your next appointment:   3 months  Provider:   Sherie Don, NP

## 2023-02-12 NOTE — Progress Notes (Signed)
Electrophysiology Office Note:    Date:  02/12/2023   ID:  Debbie Wyatt, [place] 21-Jun-1945, MRN [id number]  [facility] Cardiologist:  None  [facility] Electrophysiologist:  Lanier Prude, MD   Referring MD: Antonieta Iba, MD   Chief Complaint: AF and WPW  History of Present Illness:    Debbie Wyatt is a 77 y.o. femalewho I am seeing today for an evaluation of AF and WPW at the request of Dr Mariah Milling.  The patient was last seen by Dr Mariah Milling on 08/09/2022.  The patient has a medical history that includes WPW, AF, obesity. Previously followed by Dr Lady Gary at [place].She was previously seen by [facility] but they signed off. She is maintained on dofetilide and xarelto. She ambulates with a cane due to gait instability.  She saw Dr Ethelda Chick at [facility] previously. Based on chart review, AP was identified in 1987 EP study. This was managed conservatively with Atenolol.  Today she reviewed her entire arrhythmia history during the clinic appointment.  She usually can tell when she is out of rhythm.  She does monitor her heart rates and rhythm using an Apple watch.  Catheter ablation is been discussed with the patient but she is hesitant to pursue any invasive treatment options for her atrial fibrillation.         Their past medical, social and family history was reveiwed.   ROS:   Please see the history of present illness.    All other systems reviewed and are negative.  EKGs/Labs/Other Studies Reviewed:    The following studies were reviewed today:  08/09/2022 ECG shows sinus rhythm. No preexcitation.   EKG Interpretation Date/Time:  Wednesday February 12 2023 10:06:42 EDT Ventricular Rate:  75 PR Interval:    QRS Duration:  88 QT Interval:  410 QTC Calculation: 457 R Axis:   15  Text Interpretation: Atrial fibrillation Confirmed by Steffanie Dunn [phone]) on 02/12/2023 10:24:14 AM    Physical Exam:    VS:  BP 100/70 (BP Location: Left Arm, Patient  Position: Sitting, Cuff Size: Normal)   Pulse 75   Ht 5\' 2"  (1.575 m)   Wt 199 lb 4 oz (90.4 kg)   SpO2 98%   BMI 36.44 kg/m     Wt Readings from Last 3 Encounters:  02/12/23 199 lb 4 oz (90.4 kg)  08/09/22 209 lb 2 oz (94.9 kg)  07/27/21 212 lb (96.2 kg)     GEN:  Well nourished, well developed in no acute distress.  Obese CARDIAC: Irregularly irregular, tachycardic, no murmurs, rubs, gallops RESPIRATORY:  Clear to auscultation without rales, wheezing or rhonchi       ASSESSMENT AND PLAN:    1. Paroxysmal atrial fibrillation (HCC)   2. WPW (Wolff-Parkinson-White syndrome)   3. Morbid obesity (HCC)   4. Encounter for long-term (current) use of high-risk medication     #Persistent AF #High risk med monitoring On dofetilide but is out of rhythm on today's EKG. QTc on today's ECG acceptable. I would like to cardiovert her to see if she will maintain sinus rhythm.  I discussed the cardioversion procedure in detail with the patient including the risks and she wishes to proceed.  She has not missed any doses of Eliquis in the last 4 weeks. Check BMP and Mag today.  #Hx of WPW No SVT episodes. No preexcitation on ECG.  Follow up 3 months with APP.   Signed, Rossie Muskrat. Lalla Brothers, MD, [facility], [facility] 02/12/2023 10:24 AM  Electrophysiology [facility]

## 2023-02-13 ENCOUNTER — Encounter: Payer: Self-pay | Admitting: Cardiology

## 2023-02-13 LAB — BASIC METABOLIC PANEL
BUN/Creatinine Ratio: 14 (ref 12–28)
BUN: 10 mg/dL (ref 8–27)
CO2: 21 mmol/L (ref 20–29)
Calcium: 9.1 mg/dL (ref 8.7–10.3)
Chloride: 101 mmol/L (ref 96–106)
Creatinine, Ser: 0.71 mg/dL (ref 0.57–1.00)
Glucose: 80 mg/dL (ref 70–99)
Potassium: 4.7 mmol/L (ref 3.5–5.2)
Sodium: 140 mmol/L (ref 134–144)
eGFR: 87 mL/min/{1.73_m2} (ref >59)

## 2023-02-13 LAB — CBC
Hematocrit: 44.1 % (ref 34.0–46.6)
Hemoglobin: 13.5 g/dL (ref 11.1–15.9)
MCH: 28.1 pg (ref 26.6–33.0)
MCHC: 30.6 g/dL — ABNORMAL LOW (ref 31.5–35.7)
MCV: 92 fL (ref 79–97)
Platelets: 451 10*3/uL — ABNORMAL HIGH (ref 150–450)
RBC: 4.8 x10E6/uL (ref 3.77–5.28)
RDW: 14.3 % (ref 11.7–15.4)
WBC: 11.5 10*3/uL — ABNORMAL HIGH (ref 3.4–10.8)

## 2023-02-13 LAB — MAGNESIUM: Magnesium: 1.8 mg/dL (ref 1.6–2.3)

## 2023-02-17 ENCOUNTER — Telehealth: Payer: Self-pay | Admitting: Cardiovascular Disease

## 2023-02-17 NOTE — Telephone Encounter (Signed)
Patient has questions about her upcoming cardioversion: 1. What time will she be released? 2. Does she need to removed all nail polish (on fingers and toes)? 3. Does she need to pre-register?

## 2023-02-17 NOTE — Telephone Encounter (Signed)
Called pt back, all questions answered, pt encouraged to call if she has further questions.  Pt thanked this Charity fundraiser for call back and info,  Pt thanked for choosing [facility]!

## 2023-02-18 ENCOUNTER — Telehealth: Payer: Self-pay | Admitting: *Deleted

## 2023-02-18 NOTE — Telephone Encounter (Signed)
No call back from the patient. Reviewed her chart today and she was seen by Dr. Lalla Brothers on 02/12/23. She is scheduled for a DCCV on 02/20/23.  Closing this encounter.

## 2023-02-18 NOTE — Telephone Encounter (Signed)
Spoke with patient and reviewed her scheduled procedure for Thursday 9/12. Reviewed all instructions and location. She verbalized understanding of our conversation and had no further questions for now.

## 2023-02-19 MED ORDER — SODIUM CHLORIDE 0.9 % IV SOLN: INTRAVENOUS | Status: DC

## 2023-02-20 ENCOUNTER — Ambulatory Visit
Admission: RE | Admit: 2023-02-20 | Discharge: 2023-02-20 | Disposition: A | Discharge status: Home / Self Care | Payer: Medicare HMO | Attending: Cardiovascular Disease | Admitting: Cardiovascular Disease

## 2023-02-20 ENCOUNTER — Encounter
Admission: RE | Disposition: A | Discharge status: Home / Self Care | Payer: Self-pay | Source: Home / Self Care | Attending: Cardiovascular Disease

## 2023-02-20 ENCOUNTER — Ambulatory Visit: Payer: Self-pay

## 2023-02-20 ENCOUNTER — Other Ambulatory Visit: Payer: Self-pay

## 2023-02-20 ENCOUNTER — Encounter: Payer: Self-pay | Admitting: Cardiovascular Disease

## 2023-02-20 DIAGNOSIS — Z8673 Personal history of transient ischemic attack (TIA), and cerebral infarction without residual deficits: Secondary | ICD-10-CM | POA: Diagnosis not present

## 2023-02-20 DIAGNOSIS — J45909 Unspecified asthma, uncomplicated: Secondary | ICD-10-CM | POA: Diagnosis not present

## 2023-02-20 DIAGNOSIS — E119 Type 2 diabetes mellitus without complications: Secondary | ICD-10-CM | POA: Insufficient documentation

## 2023-02-20 DIAGNOSIS — I4819 Other persistent atrial fibrillation: Secondary | ICD-10-CM | POA: Diagnosis not present

## 2023-02-20 DIAGNOSIS — K219 Gastro-esophageal reflux disease without esophagitis: Secondary | ICD-10-CM | POA: Diagnosis not present

## 2023-02-20 DIAGNOSIS — I480 Paroxysmal atrial fibrillation: Secondary | ICD-10-CM | POA: Diagnosis present

## 2023-02-20 DIAGNOSIS — Z01810 Encounter for preprocedural cardiovascular examination: Secondary | ICD-10-CM

## 2023-02-20 DIAGNOSIS — E785 Hyperlipidemia, unspecified: Secondary | ICD-10-CM | POA: Diagnosis not present

## 2023-02-20 DIAGNOSIS — I456 Pre-excitation syndrome: Secondary | ICD-10-CM | POA: Diagnosis not present

## 2023-02-20 DIAGNOSIS — I4892 Unspecified atrial flutter: Secondary | ICD-10-CM | POA: Diagnosis not present

## 2023-02-20 DIAGNOSIS — H409 Unspecified glaucoma: Secondary | ICD-10-CM | POA: Diagnosis not present

## 2023-02-20 DIAGNOSIS — I441 Atrioventricular block, second degree: Secondary | ICD-10-CM | POA: Diagnosis not present

## 2023-02-20 DIAGNOSIS — Z6836 Body mass index (BMI) 36.0-36.9, adult: Secondary | ICD-10-CM | POA: Insufficient documentation

## 2023-02-20 DIAGNOSIS — R0602 Shortness of breath: Secondary | ICD-10-CM

## 2023-02-20 DIAGNOSIS — I10 Essential (primary) hypertension: Secondary | ICD-10-CM | POA: Insufficient documentation

## 2023-02-20 HISTORY — PX: CARDIOVERSION: SHX1299

## 2023-02-20 SURGERY — CARDIOVERSION
Anesthesia: General

## 2023-02-20 MED ORDER — LIDOCAINE HCL (PF) 2 % IJ SOLN
INTRAMUSCULAR | Status: DC | PRN
Start: 2023-02-20 — End: 2023-02-20
  Administered 2023-02-20: 100 mg via INTRADERMAL

## 2023-02-20 MED ORDER — PROPOFOL 10 MG/ML IV BOLUS
INTRAVENOUS | Status: DC | PRN
  Administered 2023-02-20: 80 mg via INTRAVENOUS

## 2023-02-20 NOTE — Interval H&P Note (Signed)
History and Physical Interval Note:  02/20/2023 10:50 AM  Debbie Wyatt  has presented today for surgery, with the diagnosis of Cardioversion   Afib.  The various methods of treatment have been discussed with the patient and family. After consideration of risks, benefits and other options for treatment, the patient has consented to  Procedure(s): CARDIOVERSION (N/A) as a surgical intervention.  The patient's history has been reviewed, patient examined, no change in status, stable for surgery.  I have reviewed the patient's chart and labs.  Questions were answered to the patient's satisfaction.     [name]

## 2023-02-20 NOTE — Anesthesia Procedure Notes (Signed)
Procedure Name: MAC Date/Time: 02/20/2023 7:28 AM  Performed by: Lily Lovings, CRNAPre-anesthesia Checklist: Patient identified, Emergency Drugs available, Suction available, Patient being monitored and Timeout performed Patient Re-evaluated:Patient Re-evaluated prior to induction Oxygen Delivery Method: Nasal cannula Preoxygenation: Pre-oxygenation with 100% oxygen Induction Type: IV induction

## 2023-02-20 NOTE — Anesthesia Postprocedure Evaluation (Signed)
Anesthesia Post Note  Patient: Debbie Wyatt  Procedure(s) Performed: CARDIOVERSION  Patient location during evaluation: Specials Recovery Anesthesia Type: General Level of consciousness: awake and alert Pain management: pain level controlled Vital Signs Assessment: post-procedure vital signs reviewed and stable Respiratory status: spontaneous breathing, nonlabored ventilation, respiratory function stable and patient connected to nasal cannula oxygen Cardiovascular status: blood pressure returned to baseline and stable Postop Assessment: no apparent nausea or vomiting Anesthetic complications: no   No notable events documented.   Last Vitals:  Vitals:   02/20/23 0800 02/20/23 0815  BP: (!) 116/58 (!) 119/53  Pulse: 60 (!) 57  Resp: 17 13  Temp:    SpO2: 97% 99%    Last Pain:  Vitals:   02/20/23 0815  TempSrc:   PainSc: 0-No pain                 Cleda Mccreedy Tamia Dial

## 2023-02-20 NOTE — Anesthesia Preprocedure Evaluation (Signed)
Anesthesia Evaluation  Patient identified by MRN, date of birth, ID band Patient awake    Reviewed: Allergy & Precautions, NPO status , Patient's Chart, lab work & pertinent test results  History of Anesthesia Complications Negative for: history of anesthetic complications  Airway Mallampati: III  TM Distance: <3 FB Neck ROM: full    Dental  (+) Chipped   Pulmonary neg shortness of breath, asthma    Pulmonary exam normal        Cardiovascular hypertension, + dysrhythmias Atrial Fibrillation      Neuro/Psych TIA negative psych ROS   GI/Hepatic Neg liver ROS,GERD  Controlled,,  Endo/Other  diabetes, Type 2    Renal/GU negative Renal ROS  negative genitourinary   Musculoskeletal   Abdominal   Peds  Hematology negative hematology ROS (+)   Anesthesia Other Findings Past Medical History: No date: Asthma No date: Diabetes mellitus without complication (HCC) No date: Glaucoma No date: Hyperlipidemia No date: Hypertension No date: Skin cancer No date: TIA (transient ischemic attack) No date: WPW (Wolff-Parkinson-White syndrome)  Past Surgical History: No date: BACK SURGERY     Comment:  x 2 No date: BREAST BIOPSY; Left     Comment:  neg 15 yrs ago No date: BREAST EXCISIONAL BIOPSY; Left No date: cataract surgery 1991: CHOLECYSTECTOMY No date: SEGMENTECOMY     Reproductive/Obstetrics negative OB ROS                             Anesthesia Physical Anesthesia Plan  ASA: 3  Anesthesia Plan: General   Post-op Pain Management:    Induction: Intravenous  PONV Risk Score and Plan: Propofol infusion and TIVA  Airway Management Planned: Natural Airway and Nasal Cannula  Additional Equipment:   Intra-op Plan:   Post-operative Plan:   Informed Consent: I have reviewed the patients History and Physical, chart, labs and discussed the procedure including the risks, benefits and  alternatives for the proposed anesthesia with the patient or authorized representative who has indicated his/her understanding and acceptance.     Dental Advisory Given  Plan Discussed with: Anesthesiologist, CRNA and Surgeon  Anesthesia Plan Comments: (Patient consented for risks of anesthesia including but not limited to:  - adverse reactions to medications - risk of airway placement if required - damage to eyes, teeth, lips or other oral mucosa - nerve damage due to positioning  - sore throat or hoarseness - Damage to heart, brain, nerves, lungs, other parts of body or loss of life  Patient voiced understanding.)       Anesthesia Quick Evaluation

## 2023-02-20 NOTE — Transfer of Care (Signed)
Immediate Anesthesia Transfer of Care Note  Patient: Debbie Wyatt  Procedure(s) Performed: CARDIOVERSION  Patient Location: Short Stay  Anesthesia Type:MAC  Level of Consciousness: drowsy and patient cooperative  Airway & Oxygen Therapy: Patient Spontanous Breathing and Patient connected to nasal cannula oxygen  Post-op Assessment: Report given to RN and Patient moving all extremities  Post vital signs:  Reviewed and stable  Last Vitals:  Vitals Value Taken Time  BP 112/45 02/20/23 0740  Temp 37 C 02/20/23 0740  Pulse 62 02/20/23 0740  Resp 21 02/20/23 0740  SpO2 97 % 02/20/23 0740    Last Pain:  Vitals:   02/20/23 0740  TempSrc: Tympanic  PainSc: 0-No pain         Complications: No notable events documented.

## 2023-02-20 NOTE — H&P (Signed)
H&P Addendum, pre-cardioversion ° °Patient was seen and evaluated prior to -cardioversion procedure °Symptoms, prior testing details again confirmed with the patient °Patient examined, no significant change from prior exam °Lab work reviewed in detail personally by myself °Patient understands risk and benefit of the procedure,  °The risks (stroke, cardiac arrhythmias rarely resulting in the need for a temporary or permanent pacemaker, skin irritation or burns and complications associated with conscious sedation including aspiration, arrhythmia, respiratory failure and death), benefits (restoration of normal sinus rhythm) and alternatives of a direct current cardioversion were explained in detail °Patient willing to proceed. ° °Signed, °Tim Gollan, MD, Ph.D °[facility]  °

## 2023-02-20 NOTE — CV Procedure (Signed)
Cardioversion procedure note For atrial fibrillation, persistent  Procedure Details:  Consent: Risks of procedure as well as the alternatives and risks of each were explained to the (patient/caregiver).  Consent for procedure obtained.  Time Out: Verified patient identification, verified procedure, site/side was marked, verified correct patient position, special equipment/implants available, medications/allergies/relevent history reviewed, required imaging and test results available.  Performed  Patient placed on cardiac monitor, pulse oximetry, supplemental oxygen as necessary.   Sedation given: propofol IV, Dr. Piscitello Pacer pads placed anterior and posterior chest.   Cardioverted 1 time(s).   Cardioverted at 150 J. Synchronized biphasic Converted to NSR   Evaluation: Findings: Post procedure EKG shows: NSR Complications: None Patient did tolerate procedure well.  Time Spent Directly with the Patient:  45 minutes   Tim Gollan, M.D., Ph.D.  

## 2023-02-24 ENCOUNTER — Encounter: Payer: Self-pay | Admitting: Cardiovascular Disease

## 2023-03-01 ENCOUNTER — Telehealth: Payer: Self-pay | Admitting: Cardiology

## 2023-03-01 NOTE — Telephone Encounter (Signed)
    Patient is s/p cardioversion on 02/20/23. She reports that following this, she had only very isolated palpitation sensation. However, today while in the kitchen, patient felt palpitations/rapid HR and noted HR of 132 bpm on her Apple Watch. She confirms taking her regularly scheduled Metoprolol Succinate 50mg  this morning as well as Tikosyn. She also took Lopressor 25 mg (her breakthrough medication) this afternoon around 3:30pm. She has no chest pain or shortness of breath but does continue with palpitations. Her HR now is controlled after taking Lopressor, in the 90s to low 100s. Given that patient is currently feeling well other than having mild palpitations, discussed managing this at home with PRN Lopressor and close monitoring of HR with her Apple Watch. If she were to develop any chest pain, shortness of breath, or dizziness/lightheadedness, or if HR were to sustain above 120-130, she should present to the nearest ED. Patient confirmed understanding with this plan.   Will forward this note to patient's EP and general cardiology providers as well as triage pool to expedite acute follow up on Monday.   Perlie Gold, PA-C

## 2023-03-02 ENCOUNTER — Encounter: Payer: Self-pay | Admitting: Cardiology

## 2023-03-03 NOTE — Progress Notes (Unsigned)
Cardiology Office Note Date:  03/04/2023  Patient ID:  Debbie Wyatt, Debbie Wyatt Oct 10, 1945, MRN [id number] PCP:  Leim Fabry, MD  Cardiologist:  None Electrophysiologist: Lanier Prude, MD    Chief Complaint: afib follow-up  History of Present Illness: Debbie Wyatt is a 77 y.o. female with PMH notable for parox AFib, WPW; seen today for Lanier Prude, MD for acute visit due to afib.   She saw Dr. Lalla Brothers for initial eval 02/12/2023 for afib. Maintained on tikosyn, but in AFib. Plan to set up for DCCV to ensure she could hold sinus and potentially pursue afib ablation. S/p DCCV 9/12 with return of AFib on 9/21 with palpitations and HR up to 130s by apple watch.   On follow-up today, she states that she held sinus rhythm for about 36h after her DCCV during which time she felt wonderful. Had good energy, profuse sweating was improved, and overall was the version of herself she knows that she could be long-term.  She continues to take eliquis, tikosyn and toprol BID. No bleeding concerns on eliquis.   Currently, she denies chest pain, palpitations, dyspnea, PND, orthopnea, nausea, vomiting, dizziness, syncope, edema, weight gain, or early satiety.    She is frustrated that her mychart messages and phone calls to clinic were not immediately returned.  AAD History: Tikosyn   Past Medical History:  Diagnosis Date   Asthma    Diabetes mellitus without complication (HCC)    Glaucoma    Hyperlipidemia    Hypertension    Skin cancer    TIA (transient ischemic attack)    WPW (Wolff-Parkinson-White syndrome)     Past Surgical History:  Procedure Laterality Date   BACK SURGERY     x 2   BREAST BIOPSY Left    neg 15 yrs ago   BREAST EXCISIONAL BIOPSY Left    CARDIOVERSION N/A 02/20/2023   Procedure: CARDIOVERSION;  Surgeon: Antonieta Iba, MD;  Location: [facility] ORS;  Service: Cardiovascular;  Laterality: N/A;   cataract surgery     CHOLECYSTECTOMY  1991    SEGMENTECOMY      Current Outpatient Medications  Medication Instructions   acetaminophen-codeine (TYLENOL #3) 300-30 MG tablet 1 tablet, Oral, As needed   apixaban (ELIQUIS) 5 mg, Oral, 2 times daily   atorvastatin (LIPITOR) 80 mg, Oral, Daily   clobetasol (TEMOVATE) 0.05 % external solution 1 application , Topical, Daily   cyclobenzaprine (FLEXERIL) 10 mg, Oral, 3 times daily   dofetilide (TIKOSYN) 250 mcg, Oral, 2 times daily   FLOVENT DISKUS 250 MCG/ACT AEPB 1 puff, Inhalation, 2 times daily   FLUoxetine (PROZAC) 40 mg, Oral, Daily   fluticasone (FLONASE) 50 MCG/ACT nasal spray 2 sprays, Each Nare, Daily   latanoprost (XALATAN) 0.005 % ophthalmic solution 1 drop, Both Eyes, Daily   levocetirizine (XYZAL) 5 mg, Oral, Every evening   lisinopril (ZESTRIL) 10 mg, Oral, Daily   metFORMIN (GLUCOPHAGE) 500 MG tablet 1 tablet, Oral, Daily   Metoprolol Succinate 25 mg, Oral, 2 times daily, Take 2 tablets in the am & 1 tablet in the pm   metoprolol tartrate (LOPRESSOR) 25 MG tablet TAKE 1 TABLET BY MOUTH DAILY AS NEEDED (FOR BREAK THROUGH TACHYCARDIA).   montelukast (SINGULAIR) 10 mg, Oral, Daily at bedtime   Multiple Vitamin (MULTIVITAMIN WITH MINERALS) TABS tablet 1 tablet, Oral, Daily   omeprazole (PRILOSEC) 40 mg, Oral, Daily   Potassium 99 MG TABS 1 tablet, Oral, Daily    Social History:  The patient  reports that she has never smoked. She has never used smokeless tobacco. She reports that she does not drink alcohol and does not use drugs.   Family History:  The patient's family history includes Breast cancer in her paternal aunt; Emphysema in her father; Heart disease in her mother.  ROS:  Please see the history of present illness. All other systems are reviewed and otherwise negative.   PHYSICAL EXAM:  VS:  BP 122/72 (BP Location: Left Arm, Patient Position: Sitting, Cuff Size: Large)   Pulse 66   Ht 5\' 2"  (1.575 m)   Wt 198 lb 3.2 oz (89.9 kg)   SpO2 98%   BMI 36.25 kg/m   BMI: Body mass index is 36.25 kg/m.  GEN- The patient is well appearing, alert and oriented x 3 today.   Lungs- Clear to ausculation bilaterally, normal work of breathing.  Heart- Regular rate and rhythm, no murmurs, rubs or gallops Extremities- No peripheral edema, warm, dry   EKG is ordered. Personal review of EKG from today shows:    EKG Interpretation Date/Time:  Tuesday March 04 2023 13:31:54 EDT Ventricular Rate:  66 PR Interval:  188 QRS Duration:  90 QT Interval:  440 QTC Calculation: 461 R Axis:   11  Text Interpretation: Normal sinus rhythm Confirmed by Sherie Don [phone]) on 03/04/2023 1:50:19 PM    Recent Labs: 02/12/2023: BUN 10; Creatinine, Ser 0.71; Hemoglobin 13.5; Magnesium 1.8; Platelets 451; Potassium 4.7; Sodium 140  No results found for requested labs within last 365 days.   Estimated Creatinine Clearance: 60.4 mL/min (by C-G formula based on SCr of 0.71 mg/dL).   Wt Readings from Last 3 Encounters:  03/04/23 198 lb 3.2 oz (89.9 kg)  02/20/23 199 lb (90.3 kg)  02/12/23 199 lb 4 oz (90.4 kg)     Additional studies reviewed include: Previous EP, cardiology notes.   TTE, 07/13/2013 (CE)   NORMAL LEFT VENTRICULAR SYSTOLIC FUNCTION WITH MILD LVH    NORMAL RIGHT VENTRICULAR SYSTOLIC FUNCTION    VALVULAR REGURGITATION: TRIVIAL MR, TRIVIAL PR, TRIVIAL TR    NO VALVULAR STENOSIS    Compared with prior Echo study on 08/29/1999: No significant changes   ASSESSMENT AND PLAN:  #) parox AFib S/p recent DCCV, held sinus for 36h Felt significantly improved while in sinus rhythm We discussed afib ablation as previously discussed with Dr. Lalla Brothers. Patient frustrated at the low likelihood of success We discussed only alternative AAD to tikosyn would amiodarone. Patient also frustrated about the potential side effects of amiodarone Recommended patient think more about options, AAD vs procedural, and to follow-up with Dr. Lalla Brothers regarding her decision(s) For now,  continue tikoyn BID Continue 25mg  toprol BID Continue 25mg  lopressor PRN for afib episode   #) Hypercoag d/t parox afib CHA2DS2-VASc Score = 7 [CHF History: 0, HTN History: 1, Diabetes History: 1, Stroke History: 2, Vascular Disease History: 0, Age Score: 2, Gender Score: 1].  Therefore, the patient's annual risk of stroke is 11.2 %.    Stroke ppx - 5mg  eliquis BID, appropriately dosed No bleeding concerns   #) h/o WPW No pre-excitation on today's EKG       Current medicines are reviewed at length with the patient today.   The patient has concerns regarding her medicines.  The following changes were made today:  none  Labs/ tests ordered today include:  Orders Placed This Encounter  Procedures   EKG 12-Lead     Disposition: Follow up with Dr. Lalla Brothers only in  3 months   Signed, Sherie Don, NP  03/04/23  1:50 PM  Electrophysiology [facility]

## 2023-03-03 NOTE — Telephone Encounter (Signed)
Spoke with patient and offered her appointment for tomorrow. She was agreeable with that day and time. She expressed request for a plan of care to help her mind at ease.     Sherie Don, NP  Sent: Mon March 03, 2023  9:00 AM  To: Antonieta Iba, MD; Jani Gravel, RN; Lanier Prude, MD; Vida Rigger Div Burl Triage         Message  Yes, let's add her to my schedule sometime this week - seems she has many questions/concerns.    Adding triage nurse to facilitate scheduling.  ----- Message -----  From: Antonieta Iba, MD  Sent: 03/02/2023  10:08 AM EDT  To: Jani Gravel, RN; Lanier Prude, MD; *    Would appear that metoprolol and antiarrhythmic not keeping her in sinus  Perhaps she would be receptive in follow-up discussions to an ablation  Sounds like this was discussed on prior visit with Dr. Bethann Humble she has follow-up with EP October 11  If Luella Cook has a cancellation, perhaps we can move her up  Thx  TG

## 2023-03-04 ENCOUNTER — Encounter: Payer: Self-pay | Admitting: Cardiology

## 2023-03-04 ENCOUNTER — Ambulatory Visit: Payer: Medicare HMO | Attending: Cardiology | Admitting: Cardiology

## 2023-03-04 VITALS — BP 122/72 | HR 66 | Ht 62.0 in | Wt 198.2 lb

## 2023-03-04 DIAGNOSIS — D6869 Other thrombophilia: Secondary | ICD-10-CM

## 2023-03-04 DIAGNOSIS — I480 Paroxysmal atrial fibrillation: Secondary | ICD-10-CM

## 2023-03-04 DIAGNOSIS — Z79899 Other long term (current) drug therapy: Secondary | ICD-10-CM

## 2023-03-04 NOTE — Patient Instructions (Signed)
Medication Instructions:  Medication options: Tikosyn, Amiodarone, or Ablation?   *If you need a refill on your cardiac medications before your next appointment, please call your pharmacy*   Follow-Up: At [facility], you and your health needs are our priority.  As part of our continuing mission to provide you with exceptional heart care, we have created designated Provider Care Teams.  These Care Teams include your primary Cardiologist (physician) and Advanced Practice Providers (APPs -  Physician Assistants and Nurse Practitioners) who all work together to provide you with the care you need, when you need it.  We recommend signing up for the patient portal called "MyChart".  Sign up information is provided on this After Visit Summary.  MyChart is used to connect with patients for Virtual Visits (Telemedicine).  Patients are able to view lab/test results, encounter notes, upcoming appointments, etc.  Non-urgent messages can be sent to your provider as well.   To learn more about what you can do with MyChart, go to [email or web address].    Your next appointment:   Next opening   Provider:   Steffanie Dunn, MD

## 2023-03-05 ENCOUNTER — Other Ambulatory Visit: Payer: Self-pay | Admitting: Cardiovascular Disease

## 2023-03-05 ENCOUNTER — Other Ambulatory Visit: Payer: Self-pay

## 2023-03-05 MED ORDER — LISINOPRIL 10 MG PO TABS: 10.0000 mg | ORAL_TABLET | Freq: Every day | ORAL | 3 refills | Status: DC

## 2023-03-11 ENCOUNTER — Encounter: Payer: Self-pay | Admitting: Cardiovascular Disease

## 2023-03-11 MED ORDER — LISINOPRIL 10 MG PO TABS: 10.0000 mg | ORAL_TABLET | Freq: Every day | ORAL | 1 refills | Status: DC

## 2023-03-21 ENCOUNTER — Ambulatory Visit: Payer: Medicare HMO | Admitting: Cardiology

## 2023-04-28 NOTE — Progress Notes (Unsigned)
Cardiology Office Note  Date:  04/29/2023   ID:  [name], [name] [birth year], MRN [id number]  PCP:  Leim Fabry, MD   Chief Complaint  Patient presents with   6 month follow up     Patient c/o shortness of breath & rapid irregular heart beats. Medications reviewed by the patient verbally.     HPI:  [name] is a 77 year old woman with past medical history of WPW syndrome Paroxysmal atrial fibrillation on dofetilide with Eliquis, prior cardioversion Essential hypertension Previously followed by cardiology at [facility] last seen October 2023  Last seen by myself in clinic March 2024  saw Dr. Lalla Brothers for initial eval 02/12/2023 for afib.  Maintained on tikosyn, but in AFib.  Underwent cardioversion 9/12 with return of AFib on 9/21 with palpitations and HR up to 130s by apple watch.   Seen by EP September 2024, felt not to be a good candidate for ablation, raised discussion of rate control versus trial of amiodarone  On visit today, remains in atrial fibs/flutter  reports that she is active, can walk up to 1 hour/day Concerned about atrial fibrillation but denies significant leg swelling no PND orthopnea, No dramatic shortness of breath  Taking metoprolol succinate 50 in the morning 20 5 in the evening on Eliquis and Tikosyn Reports to walking, dietary changes, weight is down  No recent echocardiogram available for review  Hx of hot flashes, sweating  Labs reviewed: Total chol 135, LDL 70,  CR 0.8  EKG personally reviewed by myself on todays visit EKG Interpretation Date/Time:  Tuesday April 29 2023 11:58:15 EST Ventricular Rate:  76 PR Interval:    QRS Duration:  94 QT Interval:  412 QTC Calculation: 463 R Axis:   17  Text Interpretation: Atrial fibrillation /flutter When compared with ECG of 04-Mar-2023 13:31, Atrial fibrillation has replaced Sinus rhythm Confirmed by [name] [phone]) on 04/29/2023 12:06:54 PM    PMH:   has a past  medical history of Asthma, Diabetes mellitus without complication (HCC), Glaucoma, Hyperlipidemia, Hypertension, Skin cancer, TIA (transient ischemic attack), and WPW (Wolff-Parkinson-White syndrome).  PSH:    Past Surgical History:  Procedure Laterality Date   BACK SURGERY     x 2   BREAST BIOPSY Left    neg 15 yrs ago   BREAST EXCISIONAL BIOPSY Left    CARDIOVERSION N/A 02/20/2023   Procedure: CARDIOVERSION;  Surgeon: Antonieta Iba, MD;  Location: [facility] ORS;  Service: Cardiovascular;  Laterality: N/A;   cataract surgery     CHOLECYSTECTOMY  1991   SEGMENTECOMY      Current Outpatient Medications  Medication Sig Dispense Refill   acetaminophen-codeine (TYLENOL #3) 300-30 MG tablet Take 1 tablet by mouth as needed.     apixaban (ELIQUIS) 5 MG TABS tablet Take 1 tablet (5 mg total) by mouth 2 (two) times daily. 180 tablet 3   atorvastatin (LIPITOR) 40 MG tablet TAKE 2 TABLETS EVERY DAY 180 tablet 3   clobetasol (TEMOVATE) 0.05 % external solution Apply 1 application topically daily.     dofetilide (TIKOSYN) 250 MCG capsule Take 1 capsule (250 mcg total) by mouth 2 (two) times daily. 180 capsule 2   FLUoxetine (PROZAC) 40 MG capsule Take 1 capsule (40 mg total) by mouth daily.  3   latanoprost (XALATAN) 0.005 % ophthalmic solution Place 1 drop into both eyes daily.     levocetirizine (XYZAL) 5 MG tablet Take 5 mg by mouth every evening.     lisinopril (ZESTRIL)  10 MG tablet Take 1 tablet (10 mg total) by mouth daily. 90 tablet 1   metFORMIN (GLUCOPHAGE) 500 MG tablet Take 1 tablet by mouth daily.     metoprolol succinate (TOPROL-XL) 25 MG 24 hr tablet TAKE 2 TABLETS IN THE MORNING AND TAKE 1 TABLET IN THE EVENING 270 tablet 3   metoprolol tartrate (LOPRESSOR) 25 MG tablet TAKE 1 TABLET BY MOUTH DAILY AS NEEDED (FOR BREAK THROUGH TACHYCARDIA). 30 tablet 5   montelukast (SINGULAIR) 10 MG tablet Take 10 mg by mouth at bedtime.     Multiple Vitamin (MULTIVITAMIN WITH MINERALS) TABS tablet  Take 1 tablet by mouth daily.     omeprazole (PRILOSEC) 40 MG capsule Take 40 mg by mouth daily.     Potassium 99 MG TABS Take 1 tablet by mouth daily.      cyclobenzaprine (FLEXERIL) 10 MG tablet Take 10 mg by mouth 3 (three) times daily. (Patient not taking: Reported on 04/29/2023)     No current facility-administered medications for this visit.    Allergies:   Oxycodone-acetaminophen, Amoxicillin, Oxycodone, Percocet [oxycodone-acetaminophen], Amoxicillin-pot clavulanate, and Valdecoxib   Social History:  The patient  reports that she has never smoked. She has never used smokeless tobacco. She reports that she does not drink alcohol and does not use drugs.   Family History:   family history includes Breast cancer in her paternal aunt; Emphysema in her father; Heart disease in her mother.   Review of Systems: Review of Systems  Constitutional: Negative.   HENT: Negative.    Respiratory: Negative.    Cardiovascular: Negative.   Gastrointestinal: Negative.   Musculoskeletal: Negative.   Neurological: Negative.   Psychiatric/Behavioral: Negative.    All other systems reviewed and are negative.   PHYSICAL EXAM: VS:  BP 100/60 (BP Location: Left Arm, Patient Position: Sitting, Cuff Size: Normal)   Pulse 76   Ht 5\' 2"  (1.575 m)   Wt 197 lb 2 oz (89.4 kg)   SpO2 98%   BMI 36.05 kg/m  , BMI Body mass index is 36.05 kg/m. Constitutional:  oriented to person, place, and time. No distress.  HENT:  Head: Grossly normal Eyes:  no discharge. No scleral icterus.  Neck: No JVD, no carotid bruits  Cardiovascular: Regular rate and rhythm, no murmurs appreciated Pulmonary/Chest: Clear to auscultation bilaterally, no wheezes or rails Abdominal: Soft.  no distension.  no tenderness.  Musculoskeletal: Normal range of motion Neurological:  normal muscle tone. Coordination normal. No atrophy Skin: Skin warm and dry Psychiatric: normal affect, pleasant  Recent Labs: 02/12/2023: BUN 10;  Creatinine, Ser 0.71; Hemoglobin 13.5; Magnesium 1.8; Platelets 451; Potassium 4.7; Sodium 140    Lipid Panel No results found for: "CHOL", "HDL", "LDLCALC", "TRIG"    Wt Readings from Last 3 Encounters:  04/29/23 197 lb 2 oz (89.4 kg)  03/04/23 198 lb 3.2 oz (89.9 kg)  02/20/23 199 lb (90.3 kg)    ASSESSMENT AND PLAN:  Problem List Items Addressed This Visit       Cardiology Problems   Paroxysmal atrial fibrillation (HCC) - Primary   Relevant Orders   EKG 12-Lead (Completed)   WPW (Wolff-Parkinson-White syndrome)   Relevant Orders   EKG 12-Lead (Completed)     Other   SOB (shortness of breath)   Relevant Orders   EKG 12-Lead (Completed)   Other Visit Diagnoses     Morbid obesity (HCC)           WPW/paroxysmal atrial fibrillation Long discussion concerning management  of atrial fibrillation For now we have recommended she increase metoprolol succinate up to 50 twice daily, hold lisinopril, stay on Tikosyn Echocardiogram ordered for baseline study On Eliquis 5 twice daily Will discuss with Dr. Lalla Brothers, it would seem as she is able to exercise for 1 hour a day, symptoms are mild.  She is not particularly convinced on taking the risk with transitioning to amiodarone, concerned about potential side effects  Hyperlipidemia Continue statin  Gait instability Exercising on a regular basis 1 hour/day, some gait instability by history  Morbid obesity Has changed diet, weight trending down      Signed, Dossie Arbour, M.D., Ph.D. [facility] [place], [place] [phone]

## 2023-04-29 ENCOUNTER — Ambulatory Visit: Payer: Medicare HMO | Attending: Cardiovascular Disease | Admitting: Cardiovascular Disease

## 2023-04-29 ENCOUNTER — Encounter: Payer: Self-pay | Admitting: Cardiovascular Disease

## 2023-04-29 VITALS — BP 100/60 | HR 76 | Ht 62.0 in | Wt 197.1 lb

## 2023-04-29 DIAGNOSIS — I456 Pre-excitation syndrome: Secondary | ICD-10-CM | POA: Diagnosis not present

## 2023-04-29 DIAGNOSIS — R0602 Shortness of breath: Secondary | ICD-10-CM

## 2023-04-29 DIAGNOSIS — I480 Paroxysmal atrial fibrillation: Secondary | ICD-10-CM

## 2023-04-29 MED ORDER — METOPROLOL SUCCINATE ER 50 MG PO TB24: 50.0000 mg | ORAL_TABLET | Freq: Two times a day (BID) | ORAL | 3 refills | Status: DC

## 2023-04-29 NOTE — Patient Instructions (Addendum)
Medication Instructions:  Please Stop lisinopril Please increase metoprolol succinate up to 50 mg twice a day  If you need a refill on your cardiac medications before your next appointment, please call your pharmacy.   Lab work: No new labs needed  Testing/Procedures: Your physician has requested that you have an echocardiogram. Echocardiography is a painless test that uses sound waves to create images of your heart. It provides your doctor with information about the size and shape of your heart and how well your heart's chambers and valves are working.   You may receive an ultrasound enhancing agent through an IV if needed to better visualize your heart during the echo. This procedure takes approximately one hour.  There are no restrictions for this procedure.  This will take place at [address] [facility] [address], [place] [address]  Please note: We ask at that you not bring children with you during ultrasound (echo/ vascular) testing. Due to room size and safety concerns, children are not allowed in the ultrasound rooms during exams. Our front office staff cannot provide observation of children in our lobby area while testing is being conducted. An adult accompanying a patient to their appointment will only be allowed in the ultrasound room at the discretion of the ultrasound technician under special circumstances. We apologize for any inconvenience.   Follow-Up: At [facility], you and your health needs are our priority.  As part of our continuing mission to provide you with exceptional heart care, we have created designated Provider Care Teams.  These Care Teams include your primary Cardiologist (physician) and Advanced Practice Providers (APPs -  Physician Assistants and Nurse Practitioners) who all work together to provide you with the care you need, when you need it.  You will need a follow up appointment in 6 months  Providers on your designated Care Team:    Nicolasa Ducking, NP Eula Listen, PA-C Cadence Fransico Michael, [place]  COVID-19 Vaccine Information can be found at: PodExchange.nl For questions related to vaccine distribution or appointments, please email vaccine@[email or web address] or call [phone].

## 2023-04-30 ENCOUNTER — Ambulatory Visit: Payer: Medicare HMO | Attending: Cardiovascular Disease

## 2023-04-30 DIAGNOSIS — I480 Paroxysmal atrial fibrillation: Secondary | ICD-10-CM | POA: Diagnosis not present

## 2023-04-30 LAB — ECHOCARDIOGRAM COMPLETE
AR max vel: 1.68 cm2
AV Area VTI: 1.88 cm2
AV Area mean vel: 1.73 cm2
AV Mean grad: 5 mm[Hg]
AV Peak grad: 8.9 mm[Hg]
Ao pk vel: 1.49 m/s
Calc EF: 49.8 %
S' Lateral: 3.6 cm
Single Plane A2C EF: 47.6 %
Single Plane A4C EF: 52.8 %

## 2023-05-12 ENCOUNTER — Encounter: Payer: Self-pay | Admitting: Cardiovascular Disease

## 2023-05-14 ENCOUNTER — Ambulatory Visit: Payer: Medicare HMO | Admitting: Cardiology

## 2023-05-15 ENCOUNTER — Telehealth: Payer: Self-pay | Admitting: Cardiovascular Disease

## 2023-05-15 NOTE — Telephone Encounter (Signed)
Patient was calling to see if Dr. Mariah Milling had spoke with Dr. Lalla Brothers yet about the medication changes. It was her understanding that they would discuss and then call her with the outcome. I do see where she is due to come in and see Dr. Lalla Brothers which we confirmed date and time. Advised that I would send this over to Dr. Mariah Milling for the outcome of their conversation. She was appreciative.

## 2023-05-15 NOTE — Telephone Encounter (Signed)
Pt c/o medication issue:  1. Name of Medication:    dofetilide (TIKOSYN) 250 MCG capsule    2. How are you currently taking this medication (dosage and times per day)?   3. Are you having a reaction (difficulty breathing--STAT)?   4. What is your medication issue?  Patient states that Dr. Mariah Milling was discussing changing medication from Tikosyn to amiodarone and patient would like to know if this will be changed or not due to upcoming refill. Please advise.

## 2023-05-17 ENCOUNTER — Other Ambulatory Visit: Payer: Self-pay | Admitting: Cardiovascular Disease

## 2023-05-19 NOTE — Telephone Encounter (Signed)
Called patient and notified her of the following from Dr. Mariah Milling.  Changing from tikosyn to amiodarone : Would probably be best to set up an appt with Suzann or Dr. Lalla Brothers to discuss Not a quick discussion Depends on level of her symptoms Will cc Dr. Humphrey Rolls   Patient verbalizes understanding.

## 2023-06-04 ENCOUNTER — Other Ambulatory Visit: Payer: Self-pay | Admitting: Cardiovascular Disease

## 2023-06-17 NOTE — Progress Notes (Signed)
 Electrophysiology Office Follow up Visit Note:    Date:  06/18/2023   ID:  Debbie Wyatt,  10/12/1945, MRN [id number]  PCP:  Jyl Railing, MD  [facility] Cardiologist:  None  [facility] Electrophysiologist:  OLE ONEIDA HOLTS, MD    Interval History:     Debbie Wyatt is a 78 y.o. female who presents for a follow up visit.  I last saw the patient February 12, 2023 for her atrial fibrillation.  She has a history that includes WPW, atrial fibrillation and obesity.  She was previously followed at [facility] and was seen by [facility] EP.  When I last saw her she was maintained on Tikosyn  and Xarelto . At the last appointment she was out of rhythm and I recommended a cardioversion to see if she would maintain sinus rhythm on Tikosyn .  She had a cardioversion on September 12 with Dr. Gollan and converted to sinus rhythm with 150 J shock.  The patient maintained normal rhythm for 36 hours and felt better during that time.  Unfortunately after 36 hours she returned to atrial fibrillation.  She remains active despite being in A-fib.  She saw [name] on March 04, 2023.  At that appointment, treatment options were discussed again with the patient including alternative antiarrhythmic drug (amiodarone) and catheter ablation.  The patient was hesitant to consider amiodarone given the potential for off target effects.  The patient contacted Dr. Gollan in early December requesting a change to amiodarone from Tikosyn .  Today she tells me that she felt better after cardioversion for 36 hours.  After that time she felt like there were conflicting radio stations in her chest.  She also felt like she was a car trying to years.  She is unsure if those sensations were related to atrial fibrillation.       Past medical, surgical, social and family history were reviewed.  ROS:   Please see the history of present illness.    All other systems reviewed and are  negative.  EKGs/Labs/Other Studies Reviewed:    The following studies were reviewed today:  April 30, 2023 echo EF 50 to 55% RV mildly reduced Moderately dilated left atrium Moderately dilated right atrium Mild MR Mild to moderate TR    EKG Interpretation Date/Time:  Wednesday June 18 2023 10:31:14 EST Ventricular Rate:  59 PR Interval:  198 QRS Duration:  90 QT Interval:  460 QTC Calculation: 455 R Axis:   10  Text Interpretation: Sinus bradycardia Confirmed by Holts Ole [phone]) on 06/18/2023 10:38:11 AM    Physical Exam:    VS:  BP 116/72 (BP Location: Left Arm, Patient Position: Sitting, Cuff Size: Large)   Pulse 63   Ht 5' 2 (1.575 m)   Wt 195 lb (88.5 kg)   SpO2 97%   BMI 35.67 kg/m     Wt Readings from Last 3 Encounters:  06/18/23 195 lb (88.5 kg)  04/29/23 197 lb 2 oz (89.4 kg)  03/04/23 198 lb 3.2 oz (89.9 kg)     GEN: no distress CARD: Regular rate and rhythm, No MRG RESP: No IWOB. CTAB.      ASSESSMENT:    1. Encounter for long-term (current) use of high-risk medication   2. Persistent atrial fibrillation (HCC)    PLAN:    In order of problems listed above:  #Persistent atrial fibrillation He is maintaining sinus rhythm after recent cardioversion on Tikosyn .  I am still unclear how often she is having true atrial fibrillation.  This will be essential in guiding next steps for her.  For now, continue Tikosyn  250 mcg by mouth twice daily.  Continue Eliquis  for stroke prophylaxis.  I am going to have her wear a 2-week ZIO monitor and follow-up with me in about 6 weeks to review the results.   QTc is acceptable for continued Tikosyn  use on today's EKG. Continue Eliquis  for stroke prophylaxis  Follow-up 6 weeks with me  Signed, Ole Holts, MD, [facility], [facility] 06/18/2023 10:38 AM    Electrophysiology [facility]

## 2023-06-18 ENCOUNTER — Ambulatory Visit: Payer: Medicare HMO

## 2023-06-18 ENCOUNTER — Ambulatory Visit: Payer: Medicare HMO | Attending: Cardiology | Admitting: Cardiology

## 2023-06-18 ENCOUNTER — Encounter: Payer: Self-pay | Admitting: Cardiology

## 2023-06-18 VITALS — BP 116/72 | HR 63 | Ht 62.0 in | Wt 195.0 lb

## 2023-06-18 DIAGNOSIS — Z79899 Other long term (current) drug therapy: Secondary | ICD-10-CM

## 2023-06-18 DIAGNOSIS — I4819 Other persistent atrial fibrillation: Secondary | ICD-10-CM

## 2023-06-18 NOTE — Patient Instructions (Signed)
Medication Instructions:  Your physician recommends that you continue on your current medications as directed. Please refer to the Current Medication list given to you today.  *If you need a refill on your cardiac medications before your next appointment, please call your pharmacy*  Testing/Procedures: Your physician has recommended that you wear an event monitor. Event monitors are medical devices that record the heart's electrical activity. Doctors most often [place] these monitors to diagnose arrhythmias. Arrhythmias are problems with the speed or rhythm of the heartbeat. The monitor is a small, portable device. You can wear one while you do your normal daily activities. This is usually used to diagnose what is causing palpitations/syncope (passing out).  Follow-Up: At [facility], you and your health needs are our priority.  As part of our continuing mission to provide you with exceptional heart care, we have created designated Provider Care Teams.  These Care Teams include your primary Cardiologist (physician) and Advanced Practice Providers (APPs -  Physician Assistants and Nurse Practitioners) who all work together to provide you with the care you need, when you need it.  Your next appointment:   6 weeks  Provider:   Steffanie Dunn, MD    Other Instructions Christena Deem- Long Term Monitor Instructions  Your physician has requested you wear a ZIO patch monitor for 14 days.  This is a single patch monitor. [employer] supplies one patch monitor per enrollment. Additional stickers are not available. Please do not apply patch if you will be having a Nuclear Stress Test,  Echocardiogram, Cardiac CT, MRI, or Chest Xray during the period you would be wearing the  monitor. The patch cannot be worn during these tests. You cannot remove and re-apply the  ZIO XT patch monitor.  Your ZIO patch monitor will be mailed 3 day USPS to your address on file. It may take 3-5 days  to receive your monitor  after you have been enrolled.  Once you have received your monitor, please review the enclosed instructions. Your monitor  has already been registered assigning a specific monitor serial # to you.  Billing and Patient Assistance Program Information  We have supplied [employer] with any of your insurance information on file for billing purposes. [employer] offers a sliding scale Patient Assistance Program for patients that do not have  insurance, or whose insurance does not completely cover the cost of the ZIO monitor.  You must apply for the Patient Assistance Program to qualify for this discounted rate.  To apply, please call [employer] at [phone], select option 4, select option 2, ask to apply for  Patient Assistance Program. Meredeth Ide will ask your household income, and how many people  are in your household. They will quote your out-of-pocket cost based on that information.  [employer] will also be able to set up a 69-month, interest-free payment plan if needed.  Applying the monitor   Shave hair from upper left chest.  Hold abrader disc by orange tab. Rub abrader in 40 strokes over the upper left chest as  indicated in your monitor instructions.  Clean area with 4 enclosed alcohol pads. Let dry.  Apply patch as indicated in monitor instructions. Patch will be placed under collarbone on left  side of chest with arrow pointing upward.  Rub patch adhesive wings for 2 minutes. Remove white label marked "1". Remove the white  label marked "2". Rub patch adhesive wings for 2 additional minutes.  While looking in a mirror, press and release button in center of patch.  A small green light will  flash 3-4 times. This will be your only indicator that the monitor has been turned on.  Do not shower for the first 24 hours. You may shower after the first 24 hours.  Press the button if you feel a symptom. You will hear a small click. Record Date, Time and  Symptom in the Patient Logbook.  When you are  ready to remove the patch, follow instructions on the last 2 pages of Patient  Logbook. Stick patch monitor onto the last page of Patient Logbook.  Place Patient Logbook in the blue and white box. Use locking tab on box and tape box closed  securely. The blue and white box has prepaid postage on it. Please place it in the mailbox as  soon as possible. Your physician should have your test results approximately 7 days after the  monitor has been mailed back to [facility].  Call [place] at [phone] if you have questions regarding  your ZIO XT patch monitor. Call them immediately if you see an orange light blinking on your  monitor.  If your monitor falls off in less than 4 days, contact our Monitor department at [phone].  If your monitor becomes loose or falls off after 4 days call [employer] at [phone] for  suggestions on securing your monitor

## 2023-06-26 DIAGNOSIS — Z79899 Other long term (current) drug therapy: Secondary | ICD-10-CM

## 2023-06-26 DIAGNOSIS — I4819 Other persistent atrial fibrillation: Secondary | ICD-10-CM

## 2023-07-31 ENCOUNTER — Other Ambulatory Visit: Payer: Self-pay | Admitting: Cardiovascular Disease

## 2023-08-05 NOTE — Progress Notes (Unsigned)
 Electrophysiology Office Follow up Visit Note:    Date:  08/06/2023   ID:  Debbie Wyatt, [place] 11/07/1945, MRN [id number]  PCP:  Leim Fabry, MD  [facility] Cardiologist:  None  [facility] Electrophysiologist:  Lanier Prude, MD    Interval History:     Debbie Wyatt is a 78 y.o. female who presents for a follow up visit.   I last saw her June 18, 2023 for her persistent atrial fibrillation on Tikosyn.  At the last appointment we discussed her continued palpitations and plan for 2-week ZIO monitor to quantify the continued burden of atrial fibrillation.  When I first met the patient we discussed catheter ablation for atrial fibrillation but it was unclear at that time whether or not she would maintain any amount of sinus rhythm.  She was ultimately started on antiarrhythmic drugs.  These have helped rhythm control but she still continues to have symptomatic atrial fibrillation.      Past medical, surgical, social and family history were reviewed.  ROS:   Please see the history of present illness.    All other systems reviewed and are negative.  EKGs/Labs/Other Studies Reviewed:    The following studies were reviewed today:  July 22, 2023 ZIO monitor personally reviewed 41% burden of atrial fibrillation        Physical Exam:    VS:  BP 130/77 (BP Location: Left Arm, Patient Position: Sitting, Cuff Size: Normal)   Pulse 82   Ht 5\' 2"  (1.575 m)   Wt 192 lb 3.2 oz (87.2 kg)   SpO2 99%   BMI 35.15 kg/m     Wt Readings from Last 3 Encounters:  08/06/23 192 lb 3.2 oz (87.2 kg)  06/18/23 195 lb (88.5 kg)  04/29/23 197 lb 2 oz (89.4 kg)     GEN: no distress CARD: RRR, No MRG RESP: No IWOB. CTAB.      ASSESSMENT:    1. Encounter for long-term (current) use of high-risk medication   2. Persistent atrial fibrillation (HCC)    PLAN:    In order of problems listed above:  #Persistent atrial fibrillation #High risk drug  monitoring-Tikosyn Continued high burden of atrial fibrillation despite treatment with Tikosyn.  I discussed treatment options today including switching to amiodarone or trying to augment rhythm control with catheter ablation.  She is a reasonable candidate for catheter ablation.  I discussed the catheter ablation procedure in detail patient including the risks, recovery and likelihood of success.  Discussed treatment options today for AF including antiarrhythmic drug therapy and ablation. Discussed risks, recovery and likelihood of success with each treatment strategy. Risk, benefits, and alternatives to EP study and ablation for afib were discussed. These risks include but are not limited to stroke, bleeding, vascular damage, tamponade, perforation, damage to the esophagus, lungs, phrenic nerve and other structures, pulmonary vein stenosis, worsening renal function, coronary vasospasm and death.  Discussed potential need for repeat ablation procedures and antiarrhythmic drugs after an initial ablation. The patient understands these risk and wishes to proceed.  We will therefore proceed with catheter ablation at the next available time.  Carto, ICE, anesthesia are requested for the procedure.  Will also obtain CT PV protocol prior to the procedure to exclude LAA thrombus and further evaluate atrial anatomy.  EKG today shows QTc acceptable for ongoing Tikosyn use.  Plan for PVI and posterior wall ablation.  #WPW By history. No preexcitation on sinus ECG's. EP study at time of AF ablation.  Signed, Steffanie Dunn, MD, [facility], [facility] 08/06/2023 9:19 AM    Electrophysiology [facility]

## 2023-08-06 ENCOUNTER — Ambulatory Visit: Payer: Medicare HMO | Attending: Cardiology | Admitting: Cardiology

## 2023-08-06 ENCOUNTER — Other Ambulatory Visit: Payer: Self-pay

## 2023-08-06 ENCOUNTER — Encounter: Payer: Self-pay | Admitting: Cardiology

## 2023-08-06 VITALS — BP 130/77 | HR 82 | Ht 62.0 in | Wt 192.2 lb

## 2023-08-06 DIAGNOSIS — Z79899 Other long term (current) drug therapy: Secondary | ICD-10-CM

## 2023-08-06 DIAGNOSIS — I4819 Other persistent atrial fibrillation: Secondary | ICD-10-CM

## 2023-08-06 NOTE — Patient Instructions (Signed)
 Medication Instructions:  Your physician recommends that you continue on your current medications as directed. Please refer to the Current Medication list given to you today.  *If you need a refill on your cardiac medications before your next appointment, please call your pharmacy*   Lab Work: BMET and CBC - please come to the [facility] office or any [facility] location to have your labs drawn within 30 days of your procedure.   Testing/Procedures: Cardiac CT  Your physician has requested that you have cardiac CT. Cardiac computed tomography (CT) is a painless test that uses an x-ray machine to take clear, detailed pictures of your heart. For further information please visit [email or web address]  We will call you to schedule your CT scan. It will be done about three weeks prior to your ablation.   Ablation Your physician has recommended that you have an ablation. Catheter ablation is a medical procedure used to treat some cardiac arrhythmias (irregular heartbeats). During catheter ablation, a long, thin, flexible tube is put into a blood vessel in your groin (upper thigh), or neck. This tube is called an ablation catheter. It is then guided to your heart through the blood vessel. Radio frequency waves destroy small areas of heart tissue where abnormal heartbeats may cause an arrhythmia to start. Please see the instruction sheet given to you today.   Follow-Up: At [facility], you and your health needs are our priority.  As part of our continuing mission to provide you with exceptional heart care, we have created designated Provider Care Teams.  These Care Teams include your primary Cardiologist (physician) and Advanced Practice Providers (APPs -  Physician Assistants and Nurse Practitioners) who all work together to provide you with the care you need, when you need it.  Your next appointment:   We will call you to schedule your post-procedure appointments

## 2023-08-14 ENCOUNTER — Other Ambulatory Visit: Payer: Self-pay | Admitting: Cardiovascular Disease

## 2023-08-19 LAB — CBC
Hematocrit: 40.7 % (ref 34.0–46.6)
Hemoglobin: 12.1 g/dL (ref 11.1–15.9)
MCH: 26 pg — ABNORMAL LOW (ref 26.6–33.0)
MCHC: 29.7 g/dL — ABNORMAL LOW (ref 31.5–35.7)
MCV: 88 fL (ref 79–97)
Platelets: 395 10*3/uL (ref 150–450)
RBC: 4.65 x10E6/uL (ref 3.77–5.28)
RDW: 15.2 % (ref 11.7–15.4)
WBC: 11.3 10*3/uL — ABNORMAL HIGH (ref 3.4–10.8)

## 2023-08-19 LAB — BASIC METABOLIC PANEL
BUN/Creatinine Ratio: 17 (ref 12–28)
BUN: 13 mg/dL (ref 8–27)
CO2: 22 mmol/L (ref 20–29)
Calcium: 9.1 mg/dL (ref 8.7–10.3)
Chloride: 101 mmol/L (ref 96–106)
Creatinine, Ser: 0.77 mg/dL (ref 0.57–1.00)
Glucose: 107 mg/dL — ABNORMAL HIGH (ref 70–99)
Potassium: 4.6 mmol/L (ref 3.5–5.2)
Sodium: 139 mmol/L (ref 134–144)
eGFR: 79 mL/min/{1.73_m2} (ref >59)

## 2023-08-23 ENCOUNTER — Other Ambulatory Visit: Payer: Self-pay | Admitting: Cardiovascular Disease

## 2023-08-25 ENCOUNTER — Telehealth: Payer: Self-pay | Admitting: Cardiovascular Disease

## 2023-08-25 NOTE — Telephone Encounter (Signed)
 Prescription refill request for Eliquis received. Indication:afib Last office visit:2/25 Scr:0.77  3/25 Age: 78 Weight:87.2  kg  Prescription refilled

## 2023-08-25 NOTE — Telephone Encounter (Signed)
 Refill request

## 2023-08-25 NOTE — Telephone Encounter (Signed)
*  STAT* If patient is at the pharmacy, call can be transferred to refill team.   1. Which medications need to be refilled? (please list name of each medication and dose if known)    ELIQUIS 5 MG TABS tablet   2. Would you like to learn more about the convenience, safety, & potential cost savings by using the [facility]?   3. Are you open to using the [facility] (Type [facility]. ).  4. Which pharmacy/location (including street and city if local pharmacy) is medication to be sent to?  [id number] - [place], [place] - [address]   5. Do they need a 30 day or 90 day supply?  30 day  Patient stated she only has 2 days left of this medication.

## 2023-08-25 NOTE — Telephone Encounter (Addendum)
 Eliquis 5mg  refill request received. Patient is 78 years old, weight-87.2kg, Crea-0.77 on 08/18/23, Diagnosis-Afib, and last seen by Dr. Lalla Brothers on 08/06/23. Dose is appropriate based on dosing criteria.   Refill sent today to requested pharmacy & Receipt confirmed

## 2023-09-09 ENCOUNTER — Telehealth (HOSPITAL_COMMUNITY): Payer: Self-pay

## 2023-09-09 ENCOUNTER — Other Ambulatory Visit (HOSPITAL_COMMUNITY): Payer: Self-pay

## 2023-09-09 NOTE — Telephone Encounter (Signed)
 Attempted to reach patient to discuss upcoming procedure, no answer. Left VM for patient to return call.

## 2023-09-09 NOTE — Addendum Note (Signed)
 Addended by: Primitivo Gauze on: 09/09/2023 03:18 PM   Modules accepted: Orders

## 2023-09-09 NOTE — Telephone Encounter (Addendum)
 Spoke with patient to complete pre-procedure call.     New medical conditions?  No Recent hospitalizations or surgeries? No Started any new medications? Mirtazapine 15 mg at bedtime Patient made aware to contact office to inform of any new medications started. Any changes in activities of daily living? No  Pre-procedure testing scheduled: CT on 09/12/23 and lab work completed Confirmed patient is taking Eliquis and will continue taking medication before procedure or it may need to be rescheduled.  Confirmed patient is scheduled for Atrial Fibrillation Ablation on Friday, April 25 with Dr. Steffanie Dunn. Instructed patient to arrive at the Main Entrance A at [facility]: [address] [place], [place] [address] and check in at Admitting at 5:30 AM  Advised of plan to go home the same day and will only stay overnight if medically necessary. You MUST have a responsible adult to drive you home and MUST be with you the first 24 hours after you arrive home or your procedure could be cancelled.  Patient verbalized understanding to information provided and is agreeable to proceed with procedure.

## 2023-09-11 ENCOUNTER — Telehealth (HOSPITAL_COMMUNITY): Payer: Self-pay | Admitting: Emergency Medicine

## 2023-09-11 NOTE — Telephone Encounter (Signed)
 Attempted to call patient regarding upcoming cardiac CT appointment. Left message on voicemail with name and callback number Rockwell Alexandria RN Navigator Cardiac Imaging [facility] [phone] Office [phone] Cell

## 2023-09-12 ENCOUNTER — Ambulatory Visit
Admission: RE | Admit: 2023-09-12 | Discharge: 2023-09-12 | Disposition: A | Discharge status: Home / Self Care | Source: Ambulatory Visit | Attending: Cardiology | Admitting: Cardiology

## 2023-09-12 DIAGNOSIS — I4819 Other persistent atrial fibrillation: Secondary | ICD-10-CM | POA: Diagnosis present

## 2023-09-12 DIAGNOSIS — Z79899 Other long term (current) drug therapy: Secondary | ICD-10-CM | POA: Insufficient documentation

## 2023-09-12 MED ORDER — SODIUM CHLORIDE 0.9 % IV SOLN: INTRAVENOUS | Status: DC

## 2023-09-12 MED ORDER — IOHEXOL 350 MG/ML SOLN
100.0000 mL | Freq: Once | INTRAVENOUS | Status: AC | PRN
  Administered 2023-09-12: 100 mL via INTRAVENOUS

## 2023-09-16 ENCOUNTER — Encounter: Payer: Self-pay | Admitting: Cardiovascular Disease

## 2023-09-25 ENCOUNTER — Telehealth (HOSPITAL_COMMUNITY): Payer: Self-pay

## 2023-09-25 DIAGNOSIS — Z01812 Encounter for preprocedural laboratory examination: Secondary | ICD-10-CM

## 2023-09-25 DIAGNOSIS — I4819 Other persistent atrial fibrillation: Secondary | ICD-10-CM

## 2023-09-25 NOTE — Telephone Encounter (Signed)
 Attempted to reach patient to discuss upcoming procedure, no answer. Left VM for patient to return call.

## 2023-09-27 LAB — CBC
Hematocrit: 34.5 % (ref 34.0–46.6)
Hemoglobin: 10.6 g/dL — ABNORMAL LOW (ref 11.1–15.9)
MCH: 26.2 pg — ABNORMAL LOW (ref 26.6–33.0)
MCHC: 30.7 g/dL — ABNORMAL LOW (ref 31.5–35.7)
MCV: 85 fL (ref 79–97)
Platelets: 346 10*3/uL (ref 150–450)
RBC: 4.04 x10E6/uL (ref 3.77–5.28)
RDW: 15.1 % (ref 11.7–15.4)
WBC: 10.5 10*3/uL (ref 3.4–10.8)

## 2023-09-27 LAB — BASIC METABOLIC PANEL WITH GFR
BUN/Creatinine Ratio: 17 (ref 12–28)
BUN: 11 mg/dL (ref 8–27)
CO2: 24 mmol/L (ref 20–29)
Calcium: 9.2 mg/dL (ref 8.7–10.3)
Chloride: 102 mmol/L (ref 96–106)
Creatinine, Ser: 0.66 mg/dL (ref 0.57–1.00)
Glucose: 85 mg/dL (ref 70–99)
Potassium: 4.8 mmol/L (ref 3.5–5.2)
Sodium: 140 mmol/L (ref 134–144)
eGFR: 90 mL/min/{1.73_m2} (ref >59)

## 2023-09-29 NOTE — Telephone Encounter (Addendum)
 Return call received from patient to discuss upcoming procedure.   CT: completed.  Labs: completed.   Any recent signs of acute illness or been started on antibiotics? Noted patient was seen at [facility] clinic for itching and irritation in the hip flexor fold, beneath the abdominal pannus. Pt reports currently using Miconazole spray to the area with noticeable improvement. She describes the area now as light pink in color with an occasional itch. Will make Dr. Marven Slimmer aware and advised patient that procedure could be postponed until skin has cleared.   Any new medications started? Lamotrigine, Trazodone  Any medications to hold? No Any missed doses of blood thinner? No Advised patient to continue taking ANTICOAGULANT: Eliquis  (Apixaban ) twice daily without missing any doses.  Medication instructions:  On the morning of your procedure DO NOT take any medication., including Eliquis  or the procedure may be rescheduled. Nothing to eat or drink after midnight prior to your procedure.  Confirmed patient is scheduled for Atrial Fibrillation Ablation on Friday, April 25 with Dr. Harvie Liner. Instructed patient to arrive at the Main Entrance A at [facility]: [address] [place], [place] [address] and check in at Admitting at 7:30 AM.  Advised of plan to go home the same day and will only stay overnight if medically necessary. You MUST have a responsible adult to drive you home and MUST be with you the first 24 hours after you arrive home or your procedure could be cancelled.  Patient verbalized understanding to all instructions provided and agreed to proceed with procedure.

## 2023-09-29 NOTE — Addendum Note (Signed)
 Addended by: Kalesha Irving O on: 09/29/2023 11:29 AM   Modules accepted: Orders

## 2023-09-30 NOTE — Telephone Encounter (Signed)
 Per Dr. Marven Slimmer, Skin needs to be clear for procedure. If still red, recommend postponing.   Attempted to reach patient, no answer. Left detailed message with provider's response per DPR on file and contact information to return call.

## 2023-10-02 NOTE — Pre-Procedure Instructions (Addendum)
 Instructed patient on the following items: Arrival time 0700 Nothing to eat or drink after midnight No meds AM of procedure Responsible person to drive you home and stay with you for 24 hrs  Have you missed any doses of anti-coagulant Eliquis -takes twice a day, hasn't missed any doses.  Don't take dose morning procedure.   Groin- she states her groins look much better.

## 2023-10-03 ENCOUNTER — Ambulatory Visit (HOSPITAL_BASED_OUTPATIENT_CLINIC_OR_DEPARTMENT_OTHER): Payer: Self-pay

## 2023-10-03 ENCOUNTER — Ambulatory Visit (HOSPITAL_COMMUNITY): Payer: Self-pay

## 2023-10-03 ENCOUNTER — Ambulatory Visit (HOSPITAL_COMMUNITY)
Admission: RE | Disposition: A | Discharge status: Home / Self Care | Payer: Self-pay | Source: Home / Self Care | Attending: Cardiology

## 2023-10-03 ENCOUNTER — Other Ambulatory Visit (HOSPITAL_COMMUNITY): Payer: Self-pay

## 2023-10-03 ENCOUNTER — Ambulatory Visit (HOSPITAL_COMMUNITY)
Admission: RE | Admit: 2023-10-03 | Discharge: 2023-10-03 | Disposition: A | Discharge status: Home / Self Care | Payer: Medicare HMO | Attending: Cardiology | Admitting: Cardiology

## 2023-10-03 ENCOUNTER — Other Ambulatory Visit: Payer: Self-pay

## 2023-10-03 DIAGNOSIS — K219 Gastro-esophageal reflux disease without esophagitis: Secondary | ICD-10-CM | POA: Insufficient documentation

## 2023-10-03 DIAGNOSIS — J45909 Unspecified asthma, uncomplicated: Secondary | ICD-10-CM

## 2023-10-03 DIAGNOSIS — I4891 Unspecified atrial fibrillation: Secondary | ICD-10-CM | POA: Diagnosis not present

## 2023-10-03 DIAGNOSIS — I10 Essential (primary) hypertension: Secondary | ICD-10-CM | POA: Diagnosis not present

## 2023-10-03 DIAGNOSIS — Z79899 Other long term (current) drug therapy: Secondary | ICD-10-CM | POA: Insufficient documentation

## 2023-10-03 DIAGNOSIS — E119 Type 2 diabetes mellitus without complications: Secondary | ICD-10-CM

## 2023-10-03 DIAGNOSIS — I4819 Other persistent atrial fibrillation: Secondary | ICD-10-CM | POA: Insufficient documentation

## 2023-10-03 HISTORY — PX: ATRIAL FIBRILLATION ABLATION: EP1191

## 2023-10-03 LAB — GLUCOSE, CAPILLARY
Glucose-Capillary: 116 mg/dL — ABNORMAL HIGH (ref 70–99)
Glucose-Capillary: 101 mg/dL — ABNORMAL HIGH (ref 70–99)

## 2023-10-03 LAB — POCT ACTIVATED CLOTTING TIME: Activated Clotting Time: 302 s

## 2023-10-03 SURGERY — ATRIAL FIBRILLATION ABLATION
Anesthesia: General

## 2023-10-03 MED ORDER — HEPARIN SODIUM (PORCINE) 1000 UNIT/ML IJ SOLN
INTRAMUSCULAR | Status: DC | PRN
  Administered 2023-10-03: 14000 [IU] via INTRAVENOUS
  Administered 2023-10-03: 4000 [IU] via INTRAVENOUS

## 2023-10-03 MED ORDER — COLCHICINE 0.6 MG PO TABS
0.6000 mg | ORAL_TABLET | Freq: Two times a day (BID) | ORAL | 0 refills | Status: DC
  Filled 2023-10-03: qty 10, 5d supply, fill #0

## 2023-10-03 MED ORDER — ATROPINE SULFATE 1 MG/ML IV SOLN
INTRAVENOUS | Status: DC | PRN
  Administered 2023-10-03: 1 mg via INTRAVENOUS

## 2023-10-03 MED ORDER — SODIUM CHLORIDE 0.9% FLUSH
3.0000 mL | Freq: Two times a day (BID) | INTRAVENOUS | Status: DC
Start: 2023-10-03 — End: 2023-10-03

## 2023-10-03 MED ORDER — SODIUM CHLORIDE 0.9% FLUSH: 3.0000 mL | INTRAVENOUS | Status: DC | PRN

## 2023-10-03 MED ORDER — PANTOPRAZOLE SODIUM 40 MG PO TBEC
40.0000 mg | DELAYED_RELEASE_TABLET | Freq: Every day | ORAL | 0 refills | Status: DC
  Filled 2023-10-03: qty 45, 45d supply, fill #0

## 2023-10-03 MED ORDER — HEPARIN (PORCINE) IN NACL 1000-0.9 UT/500ML-% IV SOLN
INTRAVENOUS | Status: DC | PRN
  Administered 2023-10-03 (×3): 500 mL

## 2023-10-03 MED ORDER — FENTANYL CITRATE (PF) 100 MCG/2ML IJ SOLN
INTRAMUSCULAR | Status: AC
  Filled 2023-10-03: qty 2

## 2023-10-03 MED ORDER — SODIUM CHLORIDE 0.9 % IV SOLN
INTRAVENOUS | Status: DC
Start: 2023-10-03 — End: 2023-10-03

## 2023-10-03 MED ORDER — PHENYLEPHRINE 80 MCG/ML (10ML) SYRINGE FOR IV PUSH (FOR BLOOD PRESSURE SUPPORT)
PREFILLED_SYRINGE | INTRAVENOUS | Status: DC | PRN
  Administered 2023-10-03 (×2): 80 ug via INTRAVENOUS

## 2023-10-03 MED ORDER — FENTANYL CITRATE (PF) 250 MCG/5ML IJ SOLN
INTRAMUSCULAR | Status: DC | PRN
  Administered 2023-10-03: 50 ug via INTRAVENOUS

## 2023-10-03 MED ORDER — PANTOPRAZOLE SODIUM 40 MG PO TBEC
40.0000 mg | DELAYED_RELEASE_TABLET | Freq: Every day | ORAL | Status: DC
  Administered 2023-10-03: 40 mg via ORAL
  Filled 2023-10-03: qty 1

## 2023-10-03 MED ORDER — COLCHICINE 0.6 MG PO TABS
0.6000 mg | ORAL_TABLET | Freq: Two times a day (BID) | ORAL | Status: DC
Start: 2023-10-03 — End: 2023-10-03
  Administered 2023-10-03: 0.6 mg via ORAL
  Filled 2023-10-03: qty 1

## 2023-10-03 MED ORDER — PROTAMINE SULFATE 10 MG/ML IV SOLN
INTRAVENOUS | Status: DC | PRN
  Administered 2023-10-03: 25 mg via INTRAVENOUS
  Administered 2023-10-03: 10 mg via INTRAVENOUS

## 2023-10-03 MED ORDER — ROCURONIUM BROMIDE 10 MG/ML (PF) SYRINGE
PREFILLED_SYRINGE | INTRAVENOUS | Status: DC | PRN
  Administered 2023-10-03: 60 mg via INTRAVENOUS

## 2023-10-03 MED ORDER — SODIUM CHLORIDE 0.9 % IV SOLN: 250.0000 mL | INTRAVENOUS | Status: DC | PRN

## 2023-10-03 MED ORDER — PHENYLEPHRINE HCL-NACL 20-0.9 MG/250ML-% IV SOLN
INTRAVENOUS | Status: DC | PRN
  Administered 2023-10-03: 30 ug/min via INTRAVENOUS

## 2023-10-03 MED ORDER — ONDANSETRON HCL 4 MG/2ML IJ SOLN: 4.0000 mg | Freq: Four times a day (QID) | INTRAMUSCULAR | Status: DC | PRN

## 2023-10-03 MED ORDER — ACETAMINOPHEN 325 MG PO TABS: 650.0000 mg | ORAL_TABLET | ORAL | Status: DC | PRN

## 2023-10-03 MED ORDER — APIXABAN 5 MG PO TABS
5.0000 mg | ORAL_TABLET | Freq: Two times a day (BID) | ORAL | Status: DC
  Administered 2023-10-03: 5 mg via ORAL
  Filled 2023-10-03: qty 1

## 2023-10-03 MED ORDER — SUGAMMADEX SODIUM 200 MG/2ML IV SOLN
INTRAVENOUS | Status: DC | PRN
  Administered 2023-10-03: 174.2 mg via INTRAVENOUS

## 2023-10-03 MED ORDER — DEXAMETHASONE SODIUM PHOSPHATE 10 MG/ML IJ SOLN
INTRAMUSCULAR | Status: DC | PRN
  Administered 2023-10-03: 10 mg via INTRAVENOUS

## 2023-10-03 MED ORDER — PROPOFOL 10 MG/ML IV BOLUS
INTRAVENOUS | Status: DC | PRN
  Administered 2023-10-03: 150 mg via INTRAVENOUS

## 2023-10-03 MED ORDER — ONDANSETRON HCL 4 MG/2ML IJ SOLN
INTRAMUSCULAR | Status: DC | PRN
Start: 2023-10-03 — End: 2023-10-03
  Administered 2023-10-03: 4 mg via INTRAVENOUS

## 2023-10-03 SURGICAL SUPPLY — 18 items
BAG SNAP BAND KOVER 36X36 (MISCELLANEOUS) IMPLANT
CABLE PFA RX CATH CONN (CABLE) IMPLANT
CATH FARAWAVE ABLATION 31 (CATHETERS) IMPLANT
CATH GE 8FR SOUNDSTAR (CATHETERS) IMPLANT
CATH OCTARAY 2.0 F 3-3-3-3-3 (CATHETERS) IMPLANT
CATH WEB BI DIR CSDF CRV REPRO (CATHETERS) IMPLANT
CLOSURE PERCLOSE PROSTYLE (VASCULAR PRODUCTS) IMPLANT
COVER SWIFTLINK CONNECTOR (BAG) ×1 IMPLANT
DILATOR VESSEL 38 20CM 16FR (INTRODUCER) IMPLANT
GUIDEWIRE INQWIRE 1.5J.035X260 (WIRE) IMPLANT
KIT VERSACROSS CNCT FARADRIVE (KITS) IMPLANT
PACK EP LF (CUSTOM PROCEDURE TRAY) ×1 IMPLANT
PAD DEFIB RADIO PHYSIO CONN (PAD) ×1 IMPLANT
PATCH CARTO3 (PAD) IMPLANT
SHEATH FARADRIVE STEERABLE (SHEATH) IMPLANT
SHEATH PINNACLE 8F 10CM (SHEATH) IMPLANT
SHEATH PINNACLE 9F 10CM (SHEATH) IMPLANT
SHEATH PROBE COVER 6X72 (BAG) IMPLANT

## 2023-10-03 NOTE — Progress Notes (Signed)
 Assumed care of pt at this time.

## 2023-10-03 NOTE — H&P (Signed)
 Electrophysiology Office Follow up Visit Note:     Date:  10/03/2023    ID:  Debbie Wyatt, [name] May 25, 1946, MRN [id number]   PCP:  Arno Lapidus, MD  [facility] Cardiologist:  None  [facility] Electrophysiologist:  Boyce Byes, MD      Interval History:       Debbie Wyatt is a 78 y.o. female who presents for a follow up visit.    I last saw her June 18, 2023 for her persistent atrial fibrillation on Tikosyn .  At the last appointment we discussed her continued palpitations and plan for 2-week ZIO monitor to quantify the continued burden of atrial fibrillation.   When I first met the patient we discussed catheter ablation for atrial fibrillation but it was unclear at that time whether or not she would maintain any amount of sinus rhythm.  She was ultimately started on antiarrhythmic drugs.  These have helped rhythm control but she still continues to have symptomatic atrial fibrillation.  Presents for AF ablation today. Procedure reviewed.  Objective Past medical, surgical, social and family history were reviewed.   ROS:   Please see the history of present illness.    All other systems reviewed and are negative.   EKGs/Labs/Other Studies Reviewed:     The following studies were reviewed today:   July 22, 2023 ZIO monitor personally reviewed 41% burden of atrial fibrillation           Physical Exam:     VS:  BP 129/85 (BP Location: Left Arm, Patient Position: Sitting, Cuff Size: Normal)   Pulse 98   Ht 5\' 2"  (1.575 m)   Wt 192 lb 3.2 oz (87.2 kg)   SpO2 99%   BMI 35.15 kg/m         Wt Readings from Last 3 Encounters:  08/06/23 192 lb 3.2 oz (87.2 kg)  06/18/23 195 lb (88.5 kg)  04/29/23 197 lb 2 oz (89.4 kg)      GEN: no distress CARD: RRR, No MRG RESP: No IWOB. CTAB.     Assessment ASSESSMENT:     1. Encounter for long-term (current) use of high-risk medication   2. Persistent atrial fibrillation (HCC)     PLAN:      In order of problems listed above:   #Persistent atrial fibrillation #High risk drug monitoring-Tikosyn  Continued high burden of atrial fibrillation despite treatment with Tikosyn .  I discussed treatment options today including switching to amiodarone or trying to augment rhythm control with catheter ablation.  She is a reasonable candidate for catheter ablation.  I discussed the catheter ablation procedure in detail patient including the risks, recovery and likelihood of success.   Discussed treatment options today for AF including antiarrhythmic drug therapy and ablation. Discussed risks, recovery and likelihood of success with each treatment strategy. Risk, benefits, and alternatives to EP study and ablation for afib were discussed. These risks include but are not limited to stroke, bleeding, vascular damage, tamponade, perforation, damage to the esophagus, lungs, phrenic nerve and other structures, pulmonary vein stenosis, worsening renal function, coronary vasospasm and death.  Discussed potential need for repeat ablation procedures and antiarrhythmic drugs after an initial ablation. The patient understands these risk and wishes to proceed.  We will therefore proceed with catheter ablation at the next available time.  Carto, ICE, anesthesia are requested for the procedure.  Will also obtain CT PV protocol prior to the procedure to exclude LAA thrombus and further evaluate atrial anatomy.  EKG today shows QTc acceptable for ongoing Tikosyn  use.   Plan for PVI and posterior wall ablation.   #WPW By history. No preexcitation on sinus ECG's. EP study at time of AF ablation.   Presents for AF ablation today. Procedure reviewed.   Signed, Harvie Liner, MD, [facility], [facility] 10/03/2023 Electrophysiology [facility]

## 2023-10-03 NOTE — Anesthesia Procedure Notes (Addendum)
 Procedure Name: Intubation Date/Time: 10/03/2023 9:39 AM  Performed by: Andee Bamberger, CRNAPre-anesthesia Checklist: Patient identified, Emergency Drugs available, Suction available and Patient being monitored Patient Re-evaluated:Patient Re-evaluated prior to induction Oxygen Delivery Method: Circle system utilized Preoxygenation: Pre-oxygenation with 100% oxygen Induction Type: IV induction Ventilation: Mask ventilation without difficulty Laryngoscope Size: Mac and 3 Grade View: Grade II Tube type: Oral Tube size: 7.0 mm Number of attempts: 1 Airway Equipment and Method: Stylet and Oral airway Placement Confirmation: ETT inserted through vocal cords under direct vision, positive ETCO2 and breath sounds checked- equal and bilateral Secured at: 23 cm Tube secured with: Tape Dental Injury: Teeth and Oropharynx as per pre-operative assessment

## 2023-10-03 NOTE — Discharge Instructions (Signed)

## 2023-10-03 NOTE — Anesthesia Postprocedure Evaluation (Signed)
 Anesthesia Post Note  Patient: Sherion Dooly Popko  Procedure(s) Performed: ATRIAL FIBRILLATION ABLATION     Patient location during evaluation: PACU Anesthesia Type: General Level of consciousness: awake and alert Pain management: pain level controlled Vital Signs Assessment: post-procedure vital signs reviewed and stable Respiratory status: spontaneous breathing, nonlabored ventilation, respiratory function stable and patient connected to nasal cannula oxygen Cardiovascular status: blood pressure returned to baseline and stable Postop Assessment: no apparent nausea or vomiting Anesthetic complications: no   No notable events documented.  Last Vitals:  Vitals:   10/03/23 1332 10/03/23 1400  BP: (!) 123/58 127/66  Pulse: 67 67  Resp: (!) 25 17  Temp:    SpO2: 94% 95%    Last Pain:  Vitals:   10/03/23 1221  TempSrc:   PainSc: 0-No pain                 Lethaniel Rave

## 2023-10-03 NOTE — Progress Notes (Signed)
 Pt ambulated to and from bathroom to void with no signs of oozing from bilateral groin sites

## 2023-10-03 NOTE — Transfer of Care (Signed)
 Immediate Anesthesia Transfer of Care Note  Patient: Debbie Wyatt  Procedure(s) Performed: ATRIAL FIBRILLATION ABLATION  Patient Location: PACU  Anesthesia Type:General  Level of Consciousness: awake  Airway & Oxygen Therapy: Patient Spontanous Breathing  Post-op Assessment: Report given to RN and Post -op Vital signs reviewed and stable  Post vital signs: Reviewed and stable  Last Vitals:  Vitals Value Taken Time  BP 141/79 10/03/23 1115  Temp    Pulse 72 10/03/23 1117  Resp 17 10/03/23 1117  SpO2 91 % 10/03/23 1117  Vitals shown include unfiled device data.  Last Pain:  Vitals:   10/03/23 0756  TempSrc: Oral  PainSc: 0-No pain         Complications: No notable events documented.

## 2023-10-03 NOTE — Anesthesia Preprocedure Evaluation (Addendum)
 Anesthesia Evaluation  Patient identified by MRN, date of birth, ID band Patient awake    Reviewed: Allergy & Precautions, NPO status , Patient's Chart, lab work & pertinent test results  History of Anesthesia Complications Negative for: history of anesthetic complications  Airway Mallampati: III  TM Distance: <3 FB Neck ROM: full    Dental no notable dental hx. (+) Chipped   Pulmonary neg shortness of breath, asthma    Pulmonary exam normal breath sounds clear to auscultation       Cardiovascular hypertension, Normal cardiovascular exam+ dysrhythmias Atrial Fibrillation  Rhythm:Regular Rate:Normal  WPW   Neuro/Psych TIA negative psych ROS   GI/Hepatic Neg liver ROS,GERD  Controlled,,  Endo/Other  diabetes, Type 2    Renal/GU negative Renal ROS  negative genitourinary   Musculoskeletal   Abdominal   Peds  Hematology negative hematology ROS (+)   Anesthesia Other Findings   Reproductive/Obstetrics negative OB ROS                             Anesthesia Physical Anesthesia Plan  ASA: 3  Anesthesia Plan: General   Post-op Pain Management:    Induction: Intravenous  PONV Risk Score and Plan: Ondansetron  and Dexamethasone   Airway Management Planned: Oral ETT  Additional Equipment:   Intra-op Plan:   Post-operative Plan:   Informed Consent: I have reviewed the patients History and Physical, chart, labs and discussed the procedure including the risks, benefits and alternatives for the proposed anesthesia with the patient or authorized representative who has indicated his/her understanding and acceptance.     Dental Advisory Given  Plan Discussed with: Anesthesiologist, CRNA and Surgeon  Anesthesia Plan Comments:        Anesthesia Quick Evaluation

## 2023-10-05 ENCOUNTER — Encounter (HOSPITAL_COMMUNITY): Payer: Self-pay | Admitting: Cardiology

## 2023-10-06 ENCOUNTER — Telehealth (HOSPITAL_COMMUNITY): Payer: Self-pay

## 2023-10-06 ENCOUNTER — Encounter (HOSPITAL_COMMUNITY): Payer: Self-pay

## 2023-10-06 MED FILL — Fentanyl Citrate Preservative Free (PF) Inj 100 MCG/2ML: INTRAMUSCULAR | Qty: 1 | Status: AC

## 2023-10-06 NOTE — Telephone Encounter (Signed)
 Attempted to reach patient to follow up with procedure completed on 10/03/23, no answer. Left VM for patient to return call.

## 2023-10-06 NOTE — Telephone Encounter (Signed)
 Spoke with patient to complete post procedure follow up call.  Patient removed large bandage at puncture sites after 24 hours and reports no complications with groin sites.   Instructions reviewed with patient:  It is normal to have bruising, tenderness and a pea or marble sized lump/knot at the groin site which can take up to three months to resolve.  Get help right away if you notice sudden swelling at the puncture site.  Check your puncture site every day for signs of infection: fever, redness, swelling, pus drainage, warmth, foul odor or excessive pain. If this occurs, please call the office at [phone], to speak with the nurse. Get help right away if your puncture site is bleeding and the bleeding does not stop after applying firm pressure to the area.  You may continue to have skipped beats/ atrial fibrillation during the first several months after your procedure.  It is very important not to miss any doses of your blood thinner Eliquis . Patient restarted taking this medication on 10/03/23.   You will follow up with the APP on 10/31/23 and follow up with the APP on 12/29/23.   Patient verbalized understanding to all instructions provided.

## 2023-10-09 ENCOUNTER — Encounter: Payer: Self-pay | Admitting: Cardiology

## 2023-10-27 ENCOUNTER — Other Ambulatory Visit: Payer: Self-pay | Admitting: Internal Medicine

## 2023-10-27 DIAGNOSIS — M7989 Other specified soft tissue disorders: Secondary | ICD-10-CM

## 2023-10-30 ENCOUNTER — Ambulatory Visit
Admission: RE | Admit: 2023-10-30 | Discharge: 2023-10-30 | Disposition: A | Discharge status: Home / Self Care | Source: Ambulatory Visit | Attending: Internal Medicine | Admitting: Internal Medicine

## 2023-10-30 DIAGNOSIS — M7989 Other specified soft tissue disorders: Secondary | ICD-10-CM | POA: Insufficient documentation

## 2023-10-30 NOTE — Progress Notes (Signed)
 Electrophysiology Clinic Note    Date:  10/31/2023  Patient ID:  Debbie, Wyatt 07-08-1945, MRN [id number] PCP:  Alena Hush, MD  Cardiologist:  None Electrophysiologist: Boyce Byes, MD   Discussed the use of AI scribe software for clinical note transcription with the patient, who gave verbal consent to proceed.   Patient Profile    Chief Complaint: AF ablation follow-up  History of Present Illness: Debbie Wyatt is a 78 y.o. female with PMH notable for parox AFib, WPW, HTN, T2DM, CVA ; seen today for Boyce Byes, MD for routine electrophysiology follow-up s/p AF Ablation.  She is s/p AF ablation with isolation of pulm veins, posterior wall. Post procedurally, she developed R leg swelling and saw PCP. U/s negative for DVT.  On follow up today, she has not had any AFib episodes that she is aware of. She has had very brief fluttering sensation in her chest that she tends to notice when she is sitting down reading. They are just a couple beats in duration, no chest pain, chest pressure, SOB. They do not feel like her previous AF episodes.   She continues to take tikosyn  and eliquis  BID, no bleeding concerns on eliquis .  Her bilateral groin sites are tender, but she denies frank pain, swelling, edema, or knots in the area.      Arrhythmia/Device History Tikosyn       ROS:  Please see the history of present illness. All other systems are reviewed and otherwise negative.    Physical Exam    VS:  BP 118/64 (BP Location: Left Arm, Patient Position: Sitting, Cuff Size: Large)   Pulse (!) 58   Resp 16   Ht 5\' 2"  (1.575 m)   Wt 204 lb (92.5 kg)   SpO2 99%   BMI 37.31 kg/m  BMI: Body mass index is 37.31 kg/m.  Wt Readings from Last 3 Encounters:  10/31/23 204 lb (92.5 kg)  10/03/23 192 lb (87.1 kg)  08/06/23 192 lb 3.2 oz (87.2 kg)     GEN- The patient is well appearing, alert and oriented x 3 today.   Lungs- Clear to ausculation  bilaterally, normal work of breathing.  Heart- Regular rate and rhythm, no murmurs, rubs or gallops Extremities- Trace peripheral edema, warm, dry    Studies Reviewed   Previous EP, cardiology notes.    EKG is ordered. Personal review of EKG from today shows:    EKG Interpretation Date/Time:  Friday Oct 31 2023 14:08:11 EDT Ventricular Rate:  52 PR Interval:  212 QRS Duration:  88 QT Interval:  486 QTC Calculation: 451 R Axis:   33  Text Interpretation: Sinus bradycardia with 1st degree A-V block When compared with ECG of 03-Oct-2023 11:35, No significant change was found Confirmed by Jamonte Curfman [phone]) on 10/31/2023 2:27:01 PM    10/03/2023 EKG - SR at 66bpm QT 448, QTC 469   Venous u/s R (DVT), 10/30/2023 No right lower extremity DVT.   Cardiac CT, 09/12/2023 1. There is normal pulmonary vein drainage into the left atrium.(3 on the right and 2 on the left) with ostial measurements as above.  2. The left atrial appendage is a Windsock type with ostial size 26 x 24 mm and length 39 mm, Area 48 mm2. There is no thrombus in the left atrial appendage. 3. The esophagus runs in the left atrial midline and is not in the proximity to any of the pulmonary veins.  4. Coronary calcium  score  of 37.4. This is 38th percentile for age/gender.  TTE, 04/30/2023  1. Left ventricular ejection fraction, by estimation, is 50 to 55%. The left ventricle has low normal function. The left ventricle has no regional wall motion abnormalities. Left ventricular diastolic parameters are indeterminate.   2. Right ventricular systolic function is mildly reduced. The right ventricular size is mildly enlarged. There is mildly elevated pulmonary artery systolic pressure. The estimated right ventricular systolic pressure is 42.6 mmHg.   3. Left atrial size was moderately dilated.   4. Right atrial size was mild to moderately dilated.   5. The mitral valve is normal in structure. Mild mitral valve regurgitation.  No evidence of mitral stenosis.   6. Tricuspid valve regurgitation is mild to moderate.   7. The aortic valve is normal in structure. Aortic valve regurgitation is mild. No aortic stenosis is present.   8. The inferior vena cava is normal in size with <50% respiratory variability, suggesting right atrial pressure of 8 mmHg.    Assessment and Plan     #) persis AFib #) tikosyn  monitoring S/p AF ablation 09/2023 by Dr. Marven Slimmer Maintaining sinus rhythm since ablation Patient's brief fluttering sensation sound like PVC vs PACs. I've asked her to continue to monitor them, and if continue to occur we'll get an updated monitor. Reassurance provided that they sound benign at this time. EKG with stable QTC  Recent electrolytes stable Continue 250mcg tikosyn  BID  #) Hypercoag d/t persis afib CHA2DS2-VASc Score = at least 7 [CHF History: 0, HTN History: 1, Diabetes History: 1, Stroke History: 2, Vascular Disease History: 0, Age Score: 2, Gender Score: 1].  Therefore, the patient's annual risk of stroke is 11.2 %.    Stroke ppx - 5mg  eliquis  BID, appropriately dosed No bleeding concerns        Current medicines are reviewed at length with the patient today.   The patient does not have concerns regarding her medicines.  The following changes were made today:  none  Labs/ tests ordered today include:  Orders Placed This Encounter  Procedures   EKG 12-Lead     Disposition: Follow up with Dr. Marven Slimmer  in 2 months    Signed, Adaline Holly, NP  10/31/23  2:29 PM  Electrophysiology [facility]

## 2023-10-31 ENCOUNTER — Encounter: Payer: Self-pay | Admitting: Cardiology

## 2023-10-31 ENCOUNTER — Ambulatory Visit: Attending: Cardiology | Admitting: Cardiology

## 2023-10-31 VITALS — BP 118/64 | HR 58 | Resp 16 | Ht 62.0 in | Wt 204.0 lb

## 2023-10-31 DIAGNOSIS — D6869 Other thrombophilia: Secondary | ICD-10-CM | POA: Diagnosis not present

## 2023-10-31 DIAGNOSIS — I4819 Other persistent atrial fibrillation: Secondary | ICD-10-CM | POA: Diagnosis not present

## 2023-10-31 DIAGNOSIS — R002 Palpitations: Secondary | ICD-10-CM

## 2023-10-31 DIAGNOSIS — Z5181 Encounter for therapeutic drug level monitoring: Secondary | ICD-10-CM | POA: Insufficient documentation

## 2023-10-31 DIAGNOSIS — Z79899 Other long term (current) drug therapy: Secondary | ICD-10-CM

## 2023-10-31 NOTE — Patient Instructions (Signed)
 Medication Instructions:  The current medical regimen is effective;  continue present plan and medications as directed. Please refer to the Current Medication list given to you today.   *If you need a refill on your cardiac medications before your next appointment, please call your pharmacy*  Follow-Up: At [facility], you and your health needs are our priority.  As part of our continuing mission to provide you with exceptional heart care, our providers are all part of one team.  This team includes your primary Cardiologist (physician) and Advanced Practice Providers or APPs (Physician Assistants and Nurse Practitioners) who all work together to provide you with the care you need, when you need it.  Your next appointment:   Keep as scheduled   Provider:   Harvie Liner, MD    We recommend signing up for the patient portal called "MyChart".  Sign up information is provided on this After Visit Summary.  MyChart is used to connect with patients for Virtual Visits (Telemedicine).  Patients are able to view lab/test results, encounter notes, upcoming appointments, etc.  Non-urgent messages can be sent to your provider as well.   To learn more about what you can do with MyChart, go to [email or web address].

## 2023-11-07 ENCOUNTER — Encounter: Payer: Self-pay | Admitting: Cardiology

## 2023-11-07 MED ORDER — METOPROLOL SUCCINATE ER 50 MG PO TB24: 50.0000 mg | ORAL_TABLET | Freq: Two times a day (BID) | ORAL | 3 refills | Status: AC

## 2023-11-18 ENCOUNTER — Telehealth: Payer: Self-pay | Admitting: Pharmacy Technician

## 2023-11-18 ENCOUNTER — Other Ambulatory Visit (HOSPITAL_COMMUNITY): Payer: Self-pay

## 2023-11-18 NOTE — Telephone Encounter (Signed)
 Hi, we received notification from [facility] that the patient's tikosyn  needs approval for the drug-drug interaction with lamotrigine er 100mg . The prior authorization is on hold under CMM KEY: [id number] until approval given. Thank you!

## 2023-11-20 ENCOUNTER — Telehealth: Payer: Self-pay | Admitting: Cardiovascular Disease

## 2023-11-20 NOTE — Telephone Encounter (Signed)
 Prior authorization is being worked on in another encounter. Waiting on provider to ok tikosyn  and lamotrigine before finishing prior authorization

## 2023-11-20 NOTE — Telephone Encounter (Signed)
 Called and spoke with patient after receiving medication interaction between lamotrigine and Tikosyn . Patient started on lamotrigine from Dr Spence Dux at [facility] for mood disorders. Has been taking for approximately 2 months. She thinks it is helping a small amount.  Interaction: Concurrent use of LAMOTRIGINE and OCT2 SUBSTRATES WITH A NARROW THERAPEUTIC INDEX may result in increased plasma levels of OCT2 substrates with a narrow therapeutic index.

## 2023-11-20 NOTE — Telephone Encounter (Signed)
*  STAT* If patient is at the pharmacy, call can be transferred to refill team.   1. Which medications need to be refilled? (please list name of each medication and dose if known)    needs a prior authorization for Dofetilide    2. Would you like to learn more about the convenience, safety, & potential cost savings by using the [facility]?     3. Are you open to using the [facility] (Type [facility].   4. Which pharmacy/location (including street and city if local pharmacy) is medication to be sent to? [facility]   5. Do they need a 30 day or 90 day supply? 90 days # 180 and refills

## 2023-11-21 ENCOUNTER — Encounter: Payer: Self-pay | Admitting: Pharmacy Technician

## 2023-11-21 ENCOUNTER — Other Ambulatory Visit (HOSPITAL_COMMUNITY): Payer: Self-pay

## 2023-11-21 ENCOUNTER — Encounter: Payer: Self-pay | Admitting: Cardiology

## 2023-11-21 MED ORDER — DOFETILIDE 250 MCG PO CAPS: 250.0000 ug | ORAL_CAPSULE | Freq: Two times a day (BID) | ORAL | 0 refills | Status: DC

## 2023-11-21 NOTE — Telephone Encounter (Signed)
 Contacted patient and LMOM and advised she should discontinue Lamotrigine and continue Tikosyn . Recommended she contact PCP and request another medication.

## 2023-11-21 NOTE — Telephone Encounter (Signed)
 [employer] wanted the form filled out that they sent in. Completed form and faxed back to [phone] as requested

## 2023-11-21 NOTE — Telephone Encounter (Signed)
 Error

## 2023-11-21 NOTE — Telephone Encounter (Signed)
   Left pt a message to see where she wants it filled since one note said [facility] and another said [facility] mail [facility]

## 2023-11-21 NOTE — Telephone Encounter (Signed)
 Per Larinda Plover: spoke to the MD and we are going to discontinue the lamotrigine  Pharmacy Patient Advocate Encounter   Received notification from CoverMyMeds that prior authorization for Dofetilide  capsules is required/requested.   Insurance verification completed.   The patient is insured through [employer] .   Per test claim: PA required; PA submitted to above mentioned insurance via CoverMyMeds Key/confirmation #/EOC [id number] Status is pending

## 2023-11-21 NOTE — Telephone Encounter (Signed)
   Ins needs to hear by 12/05/23 or they will deny. Form scanned in media

## 2023-12-03 ENCOUNTER — Ambulatory Visit: Admitting: Cardiology

## 2023-12-14 ENCOUNTER — Other Ambulatory Visit: Payer: Self-pay | Admitting: Cardiology

## 2023-12-29 ENCOUNTER — Ambulatory Visit: Admitting: Cardiology

## 2023-12-30 NOTE — Progress Notes (Unsigned)
  Electrophysiology Office Follow up Visit Note:    Date:  12/31/2023   ID:  Debbie Wyatt Number One, [place] 1945-07-13, MRN [id number]  PCP:  Salli Amato, MD  [facility] Cardiologist:  None  [facility] Electrophysiologist:  Debbie ONEIDA HOLTS, MD    Interval History:     Debbie Wyatt is a 78 y.o. female who presents for a follow up visit.   She had an A-fib ablation October 03, 2023.  She saw [name] Oct 31, 2023.  At that appointment she reported no known episodes of atrial fibrillation.  She takes Tikosyn  to augment her rhythm control.  Today she is doing well.  She is doing well.  No sustained recurrence of atrial fibrillation since I last saw her.  She is pleased with her results thus far.       Past medical, surgical, social and family history were reviewed.  ROS:   Please see the history of present illness.    All other systems reviewed and are negative.  EKGs/Labs/Other Studies Reviewed:    The following studies were reviewed today:     EKG Interpretation Date/Time:  Wednesday December 31 2023 10:56:34 EDT Ventricular Rate:  67 PR Interval:  210 QRS Duration:  88 QT Interval:  444 QTC Calculation: 469 R Axis:   0  Text Interpretation: Sinus rhythm with 1st degree A-V block Confirmed by Wyatt Debbie [phone]) on 12/31/2023 11:01:21 AM    Physical Exam:    VS:  BP 115/60 (BP Location: Left Arm, Patient Position: Sitting, Cuff Size: Normal)   Pulse 67   Ht 5' 2 (1.575 m)   Wt 201 lb 9.6 oz (91.4 kg)   SpO2 98%   BMI 36.87 kg/m     Wt Readings from Last 3 Encounters:  12/31/23 201 lb 9.6 oz (91.4 kg)  10/31/23 204 lb (92.5 kg)  10/03/23 192 lb (87.1 kg)     GEN: no distress CARD: RRR, No MRG RESP: No IWOB. CTAB.      ASSESSMENT:    1. Persistent atrial fibrillation (HCC)   2. Encounter for long-term (current) use of high-risk medication    PLAN:    In order of problems listed above:  #Persistent atrial fibrillation #High risk med  monitoring-Tikosyn  Maintaining sinus rhythm.  QTc acceptable for ongoing Tikosyn  use. Repeat BMP and magnesium today  Follow-up 4 months with APP.   Signed, Debbie HOLTS, MD, [facility], [facility] 12/31/2023 11:01 AM    Electrophysiology  [facility]

## 2023-12-31 ENCOUNTER — Encounter: Payer: Self-pay | Admitting: Cardiology

## 2023-12-31 ENCOUNTER — Other Ambulatory Visit: Payer: Self-pay

## 2023-12-31 ENCOUNTER — Ambulatory Visit: Attending: Cardiology | Admitting: Cardiology

## 2023-12-31 VITALS — BP 115/60 | HR 67 | Ht 62.0 in | Wt 201.6 lb

## 2023-12-31 DIAGNOSIS — I4819 Other persistent atrial fibrillation: Secondary | ICD-10-CM

## 2023-12-31 DIAGNOSIS — Z79899 Other long term (current) drug therapy: Secondary | ICD-10-CM

## 2023-12-31 NOTE — Patient Instructions (Signed)
 Medication Instructions:  Your physician recommends that you continue on your current medications as directed. Please refer to the Current Medication list given to you today.  *If you need a refill on your cardiac medications before your next appointment, please call your pharmacy*  Lab Work: TODAY:  BMET and Mag  Follow-Up: At [facility], you and your health needs are our priority.  As part of our continuing mission to provide you with exceptional heart care, our providers are all part of one team.  This team includes your primary Cardiologist (physician) and Advanced Practice Providers or APPs (Physician Assistants and Nurse Practitioners) who all work together to provide you with the care you need, when you need it.  Your next appointment:   4 months  Provider:   Suzann Riddle, NP

## 2024-01-01 LAB — BASIC METABOLIC PANEL WITH GFR
BUN/Creatinine Ratio: 12 (ref 12–28)
BUN: 9 mg/dL (ref 8–27)
CO2: 26 mmol/L (ref 20–29)
Calcium: 9.3 mg/dL (ref 8.7–10.3)
Chloride: 102 mmol/L (ref 96–106)
Creatinine, Ser: 0.75 mg/dL (ref 0.57–1.00)
Glucose: 111 mg/dL — ABNORMAL HIGH (ref 70–99)
Potassium: 4.8 mmol/L (ref 3.5–5.2)
Sodium: 142 mmol/L (ref 134–144)
eGFR: 81 mL/min/1.73 (ref >59)

## 2024-01-01 LAB — MAGNESIUM: Magnesium: 1.7 mg/dL (ref 1.6–2.3)

## 2024-01-06 ENCOUNTER — Other Ambulatory Visit: Payer: Self-pay | Admitting: Cardiovascular Disease

## 2024-01-09 ENCOUNTER — Telehealth: Payer: Self-pay | Admitting: Cardiovascular Disease

## 2024-01-09 NOTE — Telephone Encounter (Signed)
*  STAT* If patient is at the pharmacy, call can be transferred to refill team.   1. Which medications need to be refilled? (please list name of each medication and dose if known)   omeprazole (PRILOSEC) 40 MG capsule   2. Would you like to learn more about the convenience, safety, & potential cost savings by using the [facility]?   3. Are you open to using the [facility] (Type [facility]. ).  4. Which pharmacy/location (including street and city if local pharmacy) is medication to be sent to?  [facility] - [place], [place] - [address]   5. Do they need a 30 day or 90 day supply?   90 day  Patient stated she only has 2-3 days left of this medication.

## 2024-01-09 NOTE — Telephone Encounter (Signed)
 Left voicemail message to call back

## 2024-01-09 NOTE — Telephone Encounter (Signed)
 For review-Historical provider, ok to fill??

## 2024-01-09 NOTE — Telephone Encounter (Signed)
 Called patient and left message for call back.

## 2024-01-09 NOTE — Telephone Encounter (Signed)
 Pt returning call to a nurse

## 2024-01-12 ENCOUNTER — Encounter: Payer: Self-pay | Admitting: Cardiovascular Disease

## 2024-01-12 MED ORDER — APIXABAN 5 MG PO TABS: 5.0000 mg | ORAL_TABLET | Freq: Two times a day (BID) | ORAL | 3 refills | Status: AC

## 2024-01-12 NOTE — Addendum Note (Signed)
 Addended by: Toryn Mcclinton D on: 01/12/2024 01:42 PM   Modules accepted: Orders

## 2024-01-12 NOTE — Telephone Encounter (Signed)
 Called patient to advise she follow up with PCP for Prilosec - left message to return call to the office

## 2024-01-29 ENCOUNTER — Other Ambulatory Visit (HOSPITAL_COMMUNITY): Payer: Self-pay

## 2024-03-08 NOTE — Progress Notes (Unsigned)
 Cardiology Office Note  Date:  03/09/2024   ID:  Debbie Wyatt, Debbie Wyatt 03-17-1946, MRN [id number]  PCP:  Salli Amato, MD   Chief Complaint  Patient presents with   6 month follow up     Doing well.     HPI:  Debbie Wyatt is a 78 year old woman with past medical history of WPW syndrome Paroxysmal atrial fibrillation on dofetilide  with Eliquis , prior cardioversion Essential hypertension A-fib ablation October 03, 2023  Who presents for follow-up of her atrial fibrillation  Last seen by myself in clinic 11/24 Seen by Dr. Cindie July 2025  Lives at home Has help at home, hired lady helps her in the yard  lives with husband, he is in his 1s  Rare palpitations, lasting seconds Stopped performing pool exercise, sedentary  Maintained on tikosyn , no Afib Prior cardioversion x2, s/p ablation  Has itchy scalp, bx pending, etiology unclear  Taking metoprolol  succinate 50 BID on Eliquis  and Tikosyn   Hx of hot flashes, sweating  Labs reviewed: Total chol 97, LDL 41,  A1c 6.5 CR 0.75  EKG personally reviewed by myself on todays visit EKG Interpretation Date/Time:  Tuesday March 09 2024 10:16:13 EDT Ventricular Rate:  67 PR Interval:  196 QRS Duration:  94 QT Interval:  432 QTC Calculation: 456 R Axis:   7  Text Interpretation: Sinus rhythm with marked sinus arrhythmia When compared with ECG of 31-Dec-2023 10:56, No significant change was found Confirmed by Perla Lye [phone]) on 03/09/2024 10:40:15 AM    PMH:   has a past medical history of Asthma, Diabetes mellitus without complication (HCC), Glaucoma, Hyperlipidemia, Hypertension, Skin cancer, TIA (transient ischemic attack), and WPW (Wolff-Parkinson-White syndrome).  PSH:    Past Surgical History:  Procedure Laterality Date   ATRIAL FIBRILLATION ABLATION N/A 10/03/2023   Procedure: ATRIAL FIBRILLATION ABLATION;  Surgeon: Cindie Ole DASEN, MD;  Location: [facility];  Service:  Cardiovascular;  Laterality: N/A;   BACK SURGERY     x 2   BREAST BIOPSY Left    neg 15 yrs ago   BREAST EXCISIONAL BIOPSY Left    CARDIOVERSION N/A 02/20/2023   Procedure: CARDIOVERSION;  Surgeon: Perla Lye PARAS, MD;  Location: [facility];  Service: Cardiovascular;  Laterality: N/A;   cataract surgery     CHOLECYSTECTOMY  1991   SEGMENTECOMY      Current Outpatient Medications  Medication Sig Dispense Refill   acetaminophen -codeine (TYLENOL  #3) 300-30 MG tablet Take 1 tablet by mouth daily as needed (Migraine).     apixaban  (ELIQUIS ) 5 MG TABS tablet Take 1 tablet (5 mg total) by mouth 2 (two) times daily. 180 tablet 3   atorvastatin  (LIPITOR) 40 MG tablet TAKE 2 TABLETS EVERY DAY 180 tablet 3   clobetasol (TEMOVATE) 0.05 % external solution Apply 1 Application topically 2 (two) times daily.     dofetilide  (TIKOSYN ) 250 MCG capsule TAKE 1 CAPSULE BY MOUTH 2 TIMES DAILY. 180 capsule 1   DULoxetine (CYMBALTA) 60 MG capsule Take 120 mg by mouth daily.     latanoprost  (XALATAN ) 0.005 % ophthalmic solution Place 1 drop into both eyes daily.     levocetirizine (XYZAL) 5 MG tablet Take 5 mg by mouth at bedtime.     metFORMIN  (GLUCOPHAGE ) 500 MG tablet Take 500 mg by mouth daily with breakfast.     metoprolol  succinate (TOPROL -XL) 50 MG 24 hr tablet Take 1 tablet (50 mg total) by mouth in the morning and at bedtime. 180 tablet 3  metoprolol  tartrate (LOPRESSOR ) 25 MG tablet TAKE 1 TABLET BY MOUTH DAILY AS NEEDED (FOR BREAK THROUGH TACHYCARDIA). 30 tablet 5   mirtazapine (REMERON) 15 MG tablet Take 7.5 mg by mouth at bedtime.     montelukast  (SINGULAIR ) 10 MG tablet Take 10 mg by mouth at bedtime.     Multiple Vitamin (MULTIVITAMIN WITH MINERALS) TABS tablet Take 1 tablet by mouth daily.     nystatin  powder Apply 1 Application topically 2 (two) times daily.     omeprazole (PRILOSEC) 40 MG capsule Take 40 mg by mouth daily.     Potassium 99 MG TABS Take 99 mg by mouth daily.     [START ON  03/17/2024] pregabalin (LYRICA) 25 MG capsule Take 25 mg by mouth daily.     tacrolimus (PROTOPIC) 0.1 % ointment Apply 1 g topically 2 (two) times daily.     traZODone (DESYREL) 50 MG tablet Take 50 mg by mouth at bedtime.  for 90 days     azelastine  (OPTIVAR ) 0.05 % ophthalmic solution INSTILL 1 DROP INTO BOTH EYES TWICE A DAY X 14 DAYS AS NEEDED FOR ALLERGIES (Patient not taking: Reported on 03/09/2024)     No current facility-administered medications for this visit.    Allergies:   Oxycodone, Amoxicillin, Other, Percocet [oxycodone-acetaminophen ], Amoxicillin-pot clavulanate, and Valdecoxib   Social History:  The patient  reports that she has never smoked. She has never used smokeless tobacco. She reports that she does not drink alcohol and does not use drugs.   Family History:   family history includes Breast cancer in her paternal aunt; Emphysema in her father; Heart disease in her mother.   Review of Systems: Review of Systems  Constitutional: Negative.   HENT: Negative.    Respiratory: Negative.    Cardiovascular: Negative.   Gastrointestinal: Negative.   Musculoskeletal: Negative.   Neurological: Negative.   Psychiatric/Behavioral: Negative.    All other systems reviewed and are negative.   PHYSICAL EXAM: VS:  BP 102/70 (BP Location: Left Arm, Patient Position: Sitting, Cuff Size: Normal)   Pulse 67   Ht 5' 2 (1.575 m)   Wt 205 lb (93 kg)   SpO2 91%   BMI 37.49 kg/m  , BMI Body mass index is 37.49 kg/m. Constitutional:  oriented to person, place, and time. No distress.  HENT:  Head: Grossly normal Eyes:  no discharge. No scleral icterus.  Neck: No JVD, no carotid bruits  Cardiovascular: Regular rate and rhythm, no murmurs appreciated Pulmonary/Chest: Clear to auscultation bilaterally, no wheezes or rales Abdominal: Soft.  no distension.  no tenderness.  Musculoskeletal: Normal range of motion Neurological:  normal muscle tone. Coordination normal. No  atrophy Skin: Skin warm and dry Psychiatric: normal affect, pleasant   Recent Labs: 09/26/2023: Hemoglobin 10.6; Platelets 346 12/31/2023: BUN 9; Creatinine, Ser 0.75; Magnesium 1.7; Potassium 4.8; Sodium 142    Lipid Panel No results found for: CHOL, HDL, LDLCALC, TRIG    Wt Readings from Last 3 Encounters:  03/09/24 205 lb (93 kg)  12/31/23 201 lb 9.6 oz (91.4 kg)  10/31/23 204 lb (92.5 kg)    ASSESSMENT AND PLAN:  Problem List Items Addressed This Visit       Cardiology Problems   Persistent atrial fibrillation (HCC) - Primary   Relevant Orders   EKG 12-Lead (Completed)   Hyperlipidemia     Other   SOB (shortness of breath)   Relevant Orders   EKG 12-Lead (Completed)   Other Visit Diagnoses  Palpitations       Relevant Orders   EKG 12-Lead (Completed)     Morbid obesity (HCC)           WPW/paroxysmal atrial fibrillation on Tikosyn  with metoprolol  succinate 50 twice daily  Eliquis  5 twice daily Previously declined transition to amiodarone, has done well on current medication regiment Ejection fraction 50 to 55%, dilated left and right atrial  Hyperlipidemia Continue statin Lipids at goal  Gait instability Recommend daily exercise, has stopped her pool exercise  Morbid obesity We have encouraged continued exercise, careful diet management     Signed, Velinda Lunger, M.D., Ph.D. [facility] [place], [place] [phone]

## 2024-03-09 ENCOUNTER — Encounter: Payer: Self-pay | Admitting: Cardiovascular Disease

## 2024-03-09 ENCOUNTER — Ambulatory Visit: Attending: Cardiovascular Disease | Admitting: Cardiovascular Disease

## 2024-03-09 VITALS — BP 102/70 | HR 67 | Ht 62.0 in | Wt 205.0 lb

## 2024-03-09 DIAGNOSIS — E782 Mixed hyperlipidemia: Secondary | ICD-10-CM | POA: Diagnosis not present

## 2024-03-09 DIAGNOSIS — I4819 Other persistent atrial fibrillation: Secondary | ICD-10-CM

## 2024-03-09 DIAGNOSIS — R002 Palpitations: Secondary | ICD-10-CM | POA: Diagnosis not present

## 2024-03-09 DIAGNOSIS — R0602 Shortness of breath: Secondary | ICD-10-CM

## 2024-03-09 NOTE — Patient Instructions (Signed)

## 2024-04-01 ENCOUNTER — Other Ambulatory Visit: Payer: Self-pay | Admitting: Cardiovascular Disease

## 2024-04-26 ENCOUNTER — Ambulatory Visit: Admitting: Cardiology

## 2024-04-26 NOTE — Progress Notes (Unsigned)
  Electrophysiology Office Follow up Visit Note:    Date:  04/28/2024   ID:  Debbie Wyatt, [place] May 19, 1946, MRN [id number]  PCP:  Salli Amato, MD  [facility] Cardiologist:  None  [facility] Electrophysiologist:  OLE ONEIDA HOLTS, MD    Interval History:     Debbie Wyatt is a 78 y.o. female who presents for a follow up visit.   I last saw the patient December 31, 2023.  She has a history of prior catheter ablation for atrial fibrillation.  She was also on dofetilide  to augment rhythm control.  She saw Dr. Gollan in follow-up in September.  She was doing well at that time.  She is on Eliquis  for stroke prophylaxis  She is doing well today.       Past medical, surgical, social and family history were reviewed.  ROS:   Please see the history of present illness.    All other systems reviewed and are negative.  EKGs/Labs/Other Studies Reviewed:    The following studies were reviewed today:     EKG Interpretation Date/Time:  Wednesday April 28 2024 09:35:47 EST Ventricular Rate:  66 PR Interval:  198 QRS Duration:  90 QT Interval:  422 QTC Calculation: 442 R Axis:   9  Text Interpretation: Normal sinus rhythm Normal ECG Confirmed by Holts Ole [phone]) on 04/28/2024 9:36:45 AM    Physical Exam:    VS:  BP 138/78   Pulse 66   Ht 5' 2 (1.575 m)   Wt 204 lb 12.8 oz (92.9 kg)   SpO2 98%   BMI 37.46 kg/m     Wt Readings from Last 3 Encounters:  04/28/24 204 lb 12.8 oz (92.9 kg)  03/09/24 205 lb (93 kg)  12/31/23 201 lb 9.6 oz (91.4 kg)     GEN: no distress CARD: RRR, No MRG RESP: No IWOB. CTAB.      ASSESSMENT:    1. Persistent atrial fibrillation (HCC)   2. Encounter for long-term (current) use of high-risk medication    PLAN:    In order of problems listed above:  #Persistent atrial fibrillation #High risk med monitoring-dofetilide  Doing well after catheter ablation and with Tikosyn . Continue Eliquis  for stroke  prophylaxis QTc acceptable for ongoing dofetilide  use Repeat BMP and mag today  I discussed my upcoming departure from [name] during today's clinic appointment.  She will continue to follow-up with pulm my partners moving forward.  Follow-up 4 months with EP MD   Signed, Ole Holts, MD, [facility], [facility] 04/28/2024 9:40 AM    Electrophysiology [facility]

## 2024-04-28 ENCOUNTER — Ambulatory Visit: Attending: Cardiology | Admitting: Cardiology

## 2024-04-28 ENCOUNTER — Encounter: Payer: Self-pay | Admitting: Cardiology

## 2024-04-28 VITALS — BP 138/78 | HR 66 | Ht 62.0 in | Wt 204.8 lb

## 2024-04-28 DIAGNOSIS — Z79899 Other long term (current) drug therapy: Secondary | ICD-10-CM

## 2024-04-28 DIAGNOSIS — I4819 Other persistent atrial fibrillation: Secondary | ICD-10-CM | POA: Diagnosis not present

## 2024-04-28 NOTE — Patient Instructions (Signed)
 Medication Instructions:  Your physician recommends that you continue on your current medications as directed. Please refer to the Current Medication list given to you today.  *If you need a refill on your cardiac medications before your next appointment, please call your pharmacy*  Lab Work: TODAY: BMET and Mag  Follow-Up: At [facility], you and your health needs are our priority.  As part of our continuing mission to provide you with exceptional heart care, our providers are all part of one team.  This team includes your primary Cardiologist (physician) and Advanced Practice Providers or APPs (Physician Assistants and Nurse Practitioners) who all work together to provide you with the care you need, when you need it.  Your next appointment:   4 months  Provider:   Fonda Kitty, MD

## 2024-04-29 LAB — MAGNESIUM: Magnesium: 1.9 mg/dL (ref 1.6–2.3)

## 2024-04-29 LAB — BASIC METABOLIC PANEL WITH GFR
BUN/Creatinine Ratio: 11 — ABNORMAL LOW (ref 12–28)
BUN: 8 mg/dL (ref 8–27)
CO2: 25 mmol/L (ref 20–29)
Calcium: 9.1 mg/dL (ref 8.7–10.3)
Chloride: 104 mmol/L (ref 96–106)
Creatinine, Ser: 0.7 mg/dL (ref 0.57–1.00)
Glucose: 96 mg/dL (ref 70–99)
Potassium: 4.9 mmol/L (ref 3.5–5.2)
Sodium: 142 mmol/L (ref 134–144)
eGFR: 88 mL/min/1.73 (ref >59)

## 2024-08-31 ENCOUNTER — Ambulatory Visit: Admitting: Cardiology
